# Patient Record
Sex: Male | Born: 1945
Health system: Southern US, Community
[De-identification: ages and names within clinical notes are randomized; demographics above are authoritative.]

## PROBLEM LIST (undated history)

## (undated) DIAGNOSIS — I739 Peripheral vascular disease, unspecified: Secondary | ICD-10-CM

## (undated) DIAGNOSIS — I2510 Atherosclerotic heart disease of native coronary artery without angina pectoris: Secondary | ICD-10-CM

## (undated) DIAGNOSIS — Z7739 Contact with and (suspected) exposure to other war theater: Secondary | ICD-10-CM

## (undated) DIAGNOSIS — I6529 Occlusion and stenosis of unspecified carotid artery: Secondary | ICD-10-CM

## (undated) DIAGNOSIS — N182 Chronic kidney disease, stage 2 (mild): Secondary | ICD-10-CM

## (undated) DIAGNOSIS — E7800 Pure hypercholesterolemia, unspecified: Secondary | ICD-10-CM

## (undated) DIAGNOSIS — I10 Essential (primary) hypertension: Secondary | ICD-10-CM

## (undated) DIAGNOSIS — S065X9A Traumatic subdural hemorrhage with loss of consciousness of unspecified duration, initial encounter: Secondary | ICD-10-CM

## (undated) DIAGNOSIS — Z77098 Contact with and (suspected) exposure to other hazardous, chiefly nonmedicinal, chemicals: Secondary | ICD-10-CM

## (undated) HISTORY — DX: Peripheral vascular disease, unspecified: I73.9

## (undated) HISTORY — PX: CORONARY ARTERY BYPASS GRAFT: SHX141

## (undated) HISTORY — DX: Traumatic subdural hemorrhage with loss of consciousness of unspecified duration, initial encounter: S06.5X9A

## (undated) HISTORY — PX: CORONARY ANGIOPLASTY WITH STENT PLACEMENT: SHX49

---

## 1998-03-15 ENCOUNTER — Ambulatory Visit (HOSPITAL_COMMUNITY): Admission: RE | Admit: 1998-03-15 | Discharge: 1998-03-15 | Payer: Self-pay | Admitting: *Deleted

## 1998-03-17 ENCOUNTER — Encounter: Payer: Self-pay | Admitting: *Deleted

## 1998-03-17 ENCOUNTER — Inpatient Hospital Stay (HOSPITAL_COMMUNITY): Admission: EM | Admit: 1998-03-17 | Discharge: 1998-03-21 | Payer: Self-pay | Admitting: *Deleted

## 2002-03-10 HISTORY — PX: CAROTID ENDARTERECTOMY: SUR193

## 2003-01-23 ENCOUNTER — Ambulatory Visit (HOSPITAL_COMMUNITY): Admission: RE | Admit: 2003-01-23 | Discharge: 2003-01-23 | Payer: Self-pay | Admitting: Cardiology

## 2003-01-31 ENCOUNTER — Ambulatory Visit (HOSPITAL_COMMUNITY): Admission: RE | Admit: 2003-01-31 | Discharge: 2003-01-31 | Payer: Self-pay | Admitting: Vascular Surgery

## 2003-02-03 ENCOUNTER — Ambulatory Visit (HOSPITAL_COMMUNITY): Admission: RE | Admit: 2003-02-03 | Discharge: 2003-02-03 | Payer: Self-pay | Admitting: Cardiothoracic Surgery

## 2003-02-03 ENCOUNTER — Encounter (INDEPENDENT_AMBULATORY_CARE_PROVIDER_SITE_OTHER): Payer: Self-pay | Admitting: Cardiology

## 2003-02-06 ENCOUNTER — Inpatient Hospital Stay (HOSPITAL_COMMUNITY): Admission: RE | Admit: 2003-02-06 | Discharge: 2003-02-11 | Payer: Self-pay | Admitting: Cardiothoracic Surgery

## 2003-03-10 ENCOUNTER — Encounter (INDEPENDENT_AMBULATORY_CARE_PROVIDER_SITE_OTHER): Payer: Self-pay | Admitting: Specialist

## 2003-03-10 ENCOUNTER — Inpatient Hospital Stay (HOSPITAL_COMMUNITY): Admission: RE | Admit: 2003-03-10 | Discharge: 2003-03-13 | Payer: Self-pay | Admitting: Vascular Surgery

## 2003-03-16 ENCOUNTER — Encounter: Admission: RE | Admit: 2003-03-16 | Discharge: 2003-03-16 | Payer: Self-pay | Admitting: Cardiothoracic Surgery

## 2003-04-01 ENCOUNTER — Inpatient Hospital Stay (HOSPITAL_COMMUNITY): Admission: EM | Admit: 2003-04-01 | Discharge: 2003-04-06 | Payer: Self-pay | Admitting: Vascular Surgery

## 2003-04-01 ENCOUNTER — Emergency Department (HOSPITAL_COMMUNITY): Admission: EM | Admit: 2003-04-01 | Discharge: 2003-04-01 | Payer: Self-pay | Admitting: Emergency Medicine

## 2003-05-25 ENCOUNTER — Ambulatory Visit (HOSPITAL_COMMUNITY): Admission: RE | Admit: 2003-05-25 | Discharge: 2003-05-25 | Payer: Self-pay | Admitting: Cardiology

## 2006-07-27 ENCOUNTER — Encounter: Payer: Self-pay | Admitting: Gastroenterology

## 2006-08-24 ENCOUNTER — Encounter (INDEPENDENT_AMBULATORY_CARE_PROVIDER_SITE_OTHER): Payer: Self-pay | Admitting: *Deleted

## 2006-08-24 ENCOUNTER — Inpatient Hospital Stay (HOSPITAL_COMMUNITY): Admission: EM | Admit: 2006-08-24 | Discharge: 2006-08-26 | Payer: Self-pay | Admitting: Emergency Medicine

## 2006-08-25 ENCOUNTER — Ambulatory Visit: Payer: Self-pay | Admitting: Vascular Surgery

## 2006-12-02 ENCOUNTER — Ambulatory Visit: Payer: Self-pay | Admitting: Vascular Surgery

## 2007-07-14 ENCOUNTER — Ambulatory Visit: Payer: Self-pay | Admitting: Vascular Surgery

## 2007-08-31 ENCOUNTER — Ambulatory Visit: Payer: Self-pay | Admitting: *Deleted

## 2007-09-22 ENCOUNTER — Encounter: Payer: Self-pay | Admitting: Gastroenterology

## 2007-10-15 ENCOUNTER — Ambulatory Visit: Payer: Self-pay | Admitting: Gastroenterology

## 2007-10-26 ENCOUNTER — Encounter: Payer: Self-pay | Admitting: Gastroenterology

## 2007-10-26 ENCOUNTER — Ambulatory Visit: Payer: Self-pay | Admitting: Gastroenterology

## 2007-10-28 ENCOUNTER — Encounter: Payer: Self-pay | Admitting: Gastroenterology

## 2007-11-28 ENCOUNTER — Emergency Department (HOSPITAL_COMMUNITY): Admission: EM | Admit: 2007-11-28 | Discharge: 2007-11-28 | Payer: Self-pay | Admitting: Emergency Medicine

## 2010-03-30 ENCOUNTER — Encounter: Payer: Self-pay | Admitting: Cardiothoracic Surgery

## 2010-07-23 NOTE — Procedures (Signed)
DUPLEX DEEP VENOUS EXAM - LOWER EXTREMITY   INDICATION:  Right lower extremity pain.   HISTORY:  Edema:  No.  Trauma/Surgery:  No.  Pain:  Yes.  PE:  No.  Previous DVT:  None.  Anticoagulants:  None.  Other:   DUPLEX EXAM:                CFV   SFV   PopV  PTV    GSV                R  L  R  L  R  L  R   L  R  L  Thrombosis    o  o  o     o     o      o  Spontaneous   +  +  +     +     +      +  Phasic        +  +  +     +     +      +  Augmentation  +  +  +     +     +      +  Compressible  +  +  +     +     +      +  Competent     +  +  +     +     +      +   Legend:  + - yes  o - no  p - partial  D - decreased   IMPRESSION:  No evidence of deep venous thrombosis noted in the right  leg.   Notified Melissa with the results.    _____________________________  P. Liliane Bade, M.D.   MG/MEDQ  D:  08/31/2007  T:  08/31/2007  Job:  [id number]

## 2010-07-23 NOTE — Consult Note (Signed)
Andre Warren, Andre Warren               ACCOUNT NO.:  [id number]   MEDICAL RECORD NO.:  [id number]          PATIENT TYPE:  INP   LOCATION:  3735                         FACILITY:  [facility]   PHYSICIAN:  Wilmon Arms. Corliss Skains, M.D. DATE OF BIRTH:  [birth year]   DATE OF CONSULTATION:  08/24/2006  DATE OF DISCHARGE:                                 CONSULTATION   REASON FOR CONSULTATION:  Partial small bowel obstruction.   HISTORY OF PRESENT ILLNESS:  The patient is a 65 year old male with a  past medical history significant for coronary artery disease and carotid  stenosis who presents with a half a day or so of severe midline  abdominal pain.  The patient states that over the weekend he had some  peanuts, but otherwise no change in his diet or habits.  The pain began  in the lower midline.  He did experience some bloating, but denied any  nausea or vomiting.  He did try to self induce some vomiting, but this  failed to improve the symptoms.  The patient reports several normal  bowel movements yesterday.  The patient also reports some left facial  numbness.  Due to these symptoms, he presented to the emergency  department for evaluation.  He was admitted by the [facility] service and  is undergoing a TIA workup.   PAST MEDICAL HISTORY:  1. Coronary artery disease status post MI 2000.  2. Carotid stenosis.  3. Hypertension.  4. Hyperlipidemia.  5. Hypothyroidism.   PAST SURGICAL HISTORY:  1. CABG x3 2004.  2. Right carotid endarterectomy by Dr. Darrick Penna in 2004.   ALLERGIES:  None.   MEDICATIONS:  1. Toprol XL 50 mg daily.  2. Cozaar 50 mg daily.  3. Lipitor 40 mg daily.  4. Zetia 10 mg daily.  5. Levothyroxine 25 mg daily.  6. Aspirin 325 daily.   SOCIAL HISTORY:  Nonsmoker, nondrinker.   FAMILY HISTORY:  Noncontributory.   PHYSICAL EXAMINATION:  VITAL SIGNS:  Temperature 97.9, pulse 70, blood  pressure 159/96, respirations 20, 98%.  GENERAL APPEARANCE:  This is a well-developed,  well-nourished male in no  apparent distress.  HEENT:  EOMI.  Sclerae anicteric.  NECK:  No masses.  No thyromegaly.  LUNGS:  Clear.  Normal respiratory effort.  HEART:  Regular rate and rhythm.  No murmur.  ABDOMEN:  Nondistended.  He has some bowel sounds.  Mild tenderness in  the lower midline.  Soft with no palpable masses.  No rebound or  guarding.  No sign of umbilical hernia.   X-RAYS:  Chest x-ray is negative.  Acute abdominal series shows a  dilated loop of small bowel in the upper abdomen.  CT scan shows partial  small bowel obstruction with apparent transition in the caliber of the  small bowel in the lower midline abdomen.  There is some mild  inflammation in the surrounding fat.  There is no sign of bowel wall  thickening.  There is some minimal free fluid in the pelvis.   LABORATORY DATA:  White count 13.5, hemoglobin 16.5, platelet count 267.  Lipase normal.  Amylase  is normal.  Liver function tests:  Bilirubin is  1.5, indirect is 1.2.  INR 1.1.  Troponin is less than 0.05.  Creatinine  is 1.2.   IMPRESSION:  1. Partial small bowel obstruction from unclear etiology.  Currently      the patient is minimally symptomatic.  2. Possible transient ischemic attacks with left-sided facial      numbness.   RECOMMENDATIONS:  Continued TIA workup as you are doing with carotid  Doppler and 2-D echo.  Continue bowel rest with n.p.o. and IV hydration.  No acute surgical indication at this time.  Repeat films in the morning.  If the patient becomes distended and begins having nausea and vomiting,  then he should have a nasogastric tube placed.      Wilmon Arms. Tsuei, M.D.  Electronically Signed     MKT/MEDQ  D:  08/24/2006  T:  08/24/2006  Job:  [id number]

## 2010-07-23 NOTE — Procedures (Signed)
CAROTID DUPLEX EXAM   INDICATION:  Follow-up of known carotid artery disease.   HISTORY:  Diabetes:  No  Cardiac:  MI in 2000 (CABG)  Hypertension:  Yes  Smoking:  No  Previous Surgery:  Right CEA in 2004  CV History:  Amaurosis Fugax No, Paresthesias No, Hemiparesis No   TECHNOLOGIST:  Stann Ore, BS.  Leta Jungling, BS, RVT.   REFERRING:   INTERPRETER:  Janetta Hora. Fields, MD.   PROCEDURE:  Carotid duplex exam.                                       RIGHT             LEFT  Brachial systolic pressure:         170               180  Brachial Doppler waveforms:         Within normal limits                Within normal limits  Vertebral direction of flow:        Antegrade         Antegrade  DUPLEX VELOCITIES (cm/sec)  CCA peak systolic                   82                92  ECA peak systolic                   58                71  ICA peak systolic                   57                188  ICA end diastolic                   25                68  PLAQUE MORPHOLOGY:                  None              Mixed  PLAQUE AMOUNT:                      None              Moderate  PLAQUE LOCATION:                    N/A               ICA   IMPRESSION:  1. No evidence of right ICA stenosis status post endarterectomy.  2. Left ICA with a 60-79% stenosis with elevated velocities noted this      visit however, the category remains unchanged.  3. Bilateral external carotid and vertebral arteries are within normal      limits.   ___________________________________________  Janetta Hora Darrick Penna, M.D.   PB/MEDQ  D:  12/02/2006  T:  12/03/2006  Job:  [id number]

## 2010-07-23 NOTE — H&P (Signed)
NAMECHARLETON, DEYOUNG NO.:  [id number]   MEDICAL RECORD NO.:  [id number]          PATIENT TYPE:  EMS   LOCATION:  MAJO                         FACILITY:  [facility]   PHYSICIAN:  Madaline Savage, MD        DATE OF BIRTH:  05/31/1945   DATE OF ADMISSION:  08/24/2006  DATE OF DISCHARGE:                              HISTORY & PHYSICAL   PRIMARY CARE PHYSICIAN:  [name], M.D.   CHIEF COMPLAINT:  Abdominal pain and numbness of face.   HISTORY OF PRESENT ILLNESS:  Mr. Biller is a 65 year old Caucasian male  with a history of coronary artery disease status post CABG, right  carotid artery stenosis, status post endarterectomy, hypertension,  hyperlipidemia, who comes in with severe abdominal pain which started  today.  He tells me that he apparently took some peanuts yesterday and  he started having abdominal pain today.  The pain was central.  He rates  it a 10/10, and it was colicky in nature.  The pain had waxing and  waning nature.  There was no radiation of the pain.  He had severe  nausea.  He was belching, but did not vomit anything.  The belching  relieved his pain somewhat, and he also noted that he had left sided  facial numbness which lasted approximately one hour.  He states that he  had similar complaints when he had his right sided carotid obstruction.  He also tells me that he had double vision one week ago which lasted  approximately 10 minutes.  At this point in time, his pain is better  controlled.   PAST MEDICAL HISTORY:  1. Coronary artery disease status post MI in 2000.  He also had a      coronary artery bypass graft x3 in 2004.  2. Right sided carotid artery stenosis.  He had right sided carotid      endarterectomy in 2004.  He follows with Dr. Darrick Penna.  He says he      also has left sided carotid artery stenosis.  3. Hypertension.  4. Hyperlipidemia.  5. Recently diagnosed hypothyroidism.   PAST SURGICAL HISTORY:  1. Coronary artery bypass  graft in 2004.  2. Right carotid endarterectomy 2004.   ALLERGIES:  No known drug allergies.   CURRENT MEDICATIONS:  1. Toprol XL 50 mg daily.  2. Cozaar 50 mg daily.  3. Lipitor 40 mg daily.  4. Zetia 10 mg daily.  5. Levothyroxine 25 mg daily.  6. Aspirin 325 mg daily.   SOCIAL HISTORY:  Denies any history of smoking, alcohol or drug abuse.   FAMILY HISTORY:  Noncontributory.   REVIEW OF SYSTEMS:  GENERAL:  She denies any recent weight loss, weight  gain, no fever or chills.  HEENT:  No headaches.  He did have double  vision a week ago.  He does complain of numbness in the right side of  his face.  CARDIOVASCULAR:  Denies chest pain or palpitations.  RESPIRATORY:  No shortness of breath or cough.  GI:  Does have abdominal  pain which has improved.  He also complains of nausea.  No diarrhea or  constipation.  EXTREMITIES:  No tingling or numbness of toes or fingers.   PHYSICAL EXAMINATION:  GENERAL:  He is alert and oriented x3.  VITAL SIGNS:  Pulse 90, blood pressure 169/96, oxygen saturation 99% on  room air.  HEENT:  Normocephalic, atraumatic.  Pupils bilaterally reactive to  light.  Mucous membranes moist.  NECK:  Supple.  No JVD.  No carotid bruits.  CARDIAC:  S1, S2 heard.  Regular rate and rhythm.  CHEST:  Clear to auscultation.  ABDOMEN:  Mild tenderness in the central abdomen due to decreased bowel  sounds.  EXTREMITIES:  No edema, cyanosis or clubbing.   LABORATORY DATA:  Lipase is 70, amylase 43, hemoglobin 18, hematocrit  53, sodium 133, potassium 4.1, chloride 104, bicarbonate 20, BUN 18,  creatinine 1.2.  Liver enzymes were within normal limits.  Troponin less  than 0.05.  X-ray of the chest shows no active disease.  Abdominal CT  shows early or partial small bowel obstruction.  CT of the abdomen and  pelvis shows partial small bowel obstruction.  No sign of bowel wall  thickening or cause of obstruction.   IMPRESSION:  1. Partial small bowel  obstruction.  2. Questionable transient ischemic attack.  3. History of carotid artery stenosis.  4. Pulmonary artery disease.  5. Hypertension.  6. Hyperlipidemia.  7. Hypothyroid.   PLAN:  This is a 65 year old gentleman who comes in with a partial small  bowel obstruction.  I will admit him to a monitored floor, keep him  n.p.o. at this time.  He is not nauseated at this time, and he is not  throwing up.  I will hold his p.o. medications this time as I will keep  him n.p.o., and I will consult surgery to see him in the morning.  I  will continue to hydrate him with IV fluids. He gives a history of a  transient ischemic attack, and he does have a significant history of  carotid artery stenosis on his left side.  I will get carotid Dopplers,  and I will also initiate workup for a TIA including a 2D echocardiogram.  I will also check a TSH on him.  I will put him on p.r.n. Lopressor for  his blood pressure.  Once he is able to take p.o., we will restart his  p.o. medications.  I will put him on DVT and GI prophylaxis.      Madaline Savage, MD  Electronically Signed     PKN/MEDQ  D:  08/24/2006  T:  08/24/2006  Job:  [id number]

## 2010-07-23 NOTE — Procedures (Signed)
CAROTID DUPLEX EXAM   INDICATION:  Followup, known carotid artery disease.   HISTORY:  Diabetes:  No.  Cardiac:  MI in 2000.  Hypertension:  Yes.  Smoking:  No.  Previous Surgery:  Right CEA.  CV History:  Amaurosis Fugax No, Paresthesias No, Hemiparesis No                                       RIGHT             LEFT  Brachial systolic pressure:         140               130  Brachial Doppler waveforms:         Biphasic          Biphasic  Vertebral direction of flow:        Antegrade         Antegrade  DUPLEX VELOCITIES (cm/sec)  CCA peak systolic                   73                83  ECA peak systolic                   50                50  ICA peak systolic                   51                168  ICA end diastolic                   20                75  PLAQUE MORPHOLOGY:                  None              Heterogenous  PLAQUE AMOUNT:                      None              Moderate  PLAQUE LOCATION:                    None              ICA, ECA   IMPRESSION:  1. 40-59% stenosis noted in the left internal carotid artery.  2. Normal carotid duplex noted in the right internal carotid artery,      status post right carotid endarterectomy.  3. Antegrade bilateral vertebral arteries.   ___________________________________________  Janetta Hora Fields, MD   MG/MEDQ  D:  07/14/2007  T:  07/14/2007  Job:  [id number]

## 2010-07-26 NOTE — Op Note (Signed)
NAME:  LIEM, COPENHAVER                         ACCOUNT NO.:  [id number]   MEDICAL RECORD NO.:  [id number]                   PATIENT TYPE:  INP   LOCATION:  2302                                 FACILITY:  [facility]   PHYSICIAN:  Di Kindle. Edilia Bo, M.D.        DATE OF BIRTH:  1945-08-19   DATE OF PROCEDURE:  04/01/2003  DATE OF DISCHARGE:                                 OPERATIVE REPORT   PREOPERATIVE DIAGNOSIS:  Right neck abscess.   POSTOPERATIVE DIAGNOSIS:  Right neck abscess.   PROCEDURE:  Incision and drainage of right neck abscess.   SURGEON:  Di Kindle. Edilia Bo, M.D.   ASSISTANT:  Toribio Harbour, N.P.   ANESTHESIA:  General.   INDICATIONS:  This is a 65 year old gentleman who had undergone a right  carotid endarterectomy with Dacron patch angioplasty on 03/09/04 for a  symptomatic right carotid stenosis.  In reviewing the operative report, it  was noted that the plaque extended very high, well up above the hypoglossal  nerve.  He developed the sudden onset of swelling in his neck last night and  came to the [facility] emergency room where I saw him, and then he was  transferred here after his CAT scan showed an abscess in the right neck.  The plan was to drain the abscess and then potentially have to remove the  Dacron patch and replace it with vein if the patch was involved.  The  procedure and the potential complications were discussed with both the  patient and his wife.  All of their questions were answered, and they were  agreeable to proceed.   TECHNIQUE:  The patient was taken to the operating room and received a  general anesthetic.  The right neck was prepped and draped in the usual  sterile fashion.  I also prepped the chest into the field, and we also  prepped the left thigh into the field in case we needed a vein.  The  previous incision was opened, and I did extend the incision further  proximally and distally.  As the neck was somewhat pulsatile,  I wanted to be  sure we could get adequate control if necessary.  There was dense scar  tissue beneath the sternocleidomastoid.  I did enter the abscess pocket,  which was approximately 3 cm in diameter.  A stat culture was obtained, and  these results were still pending.  Both aerobic and anaerobic cultures were  obtained.  This pocket was thoroughly irrigated with antibiotic solution,  and there was no exposed Dacron graft.  I did further dissect down through  the scar tissue to be sure there was no other undrained areas, and I could  not find any other undrained areas.  There was significant scar tissue  present.  Given the very difficult dissection, it was noted at the time of  the original procedure, and given that at this point that there was exposed  patch,  I did not think it would be wise to more aggressively try to expose  the carotid artery and patch as I think this has a good chance of healing as  this appears to have simply been localized abscess.  In addition because of  the very high nature of the previous plaque and the high nature of the  repair with significant scar tissue, I think this would be associated with  significant risk to replace it, especially if it is not necessary.  Therefore, at this point, we simply packed the wound after hemostasis was  obtained.  I closed the wound from both ends with a deep layer of 3-0 Vicryl  and then interrupted 3-0 nylons and left the area of the abscess open so  that it could be packed.  The patient  was on Rocephin, and we will tailor his antibiotics pending the results of  this and a gram stain and culture results.  A sterile dressing was applied.  The patient tolerated the procedure well and was transferred to the recovery  room in satisfactory condition.  All needle and sponge counts were correct.                                               Di Kindle. Edilia Bo, M.D.    CSD/MEDQ  D:  04/01/2003  T:  04/01/2003  Job:   [id number]

## 2010-07-26 NOTE — Op Note (Signed)
NAME:  Andre Warren, Andre Warren                         ACCOUNT NO.:  [id number]   MEDICAL RECORD NO.:  [id number]                   PATIENT TYPE:  INP   LOCATION:  2855                                 FACILITY:  [facility]   PHYSICIAN:  Janetta Hora. Fields, MD               DATE OF BIRTH:  07-02-1945   DATE OF PROCEDURE:  03/10/2003  DATE OF DISCHARGE:                                 OPERATIVE REPORT   PREOPERATIVE DIAGNOSIS:  Symptomatic right internal carotid artery stenosis.   POSTOPERATIVE DIAGNOSIS:  Symptomatic right internal carotid artery  stenosis.   PROCEDURE:  Right carotid endarterectomy.   ANESTHESIA:  General.   ASSISTANT:  Balinda Quails, M.D.   INDICATIONS:  The patient is a 65 year old male with a known 75% right  internal carotid artery stenosis by arteriography.  He has recently begun to  have TIA symptoms with numbness and tingling in his left arm and left leg,  which have lasted less than 30 minutes.  He has also had some right eye  blurriness.   FINDINGS:  1. High internal carotid artery stenosis requiring extensive mobilization of     cranial nerve XII.  2. Dacron patch angioplasty.  3. Pruitt hunt.   OPERATIVE DETAILS:  After obtaining informed consent, the patient was taken  to the operating room.  Informed consent included risks of stroke,  myocardial infarction, cranial nerve injury, and infection, among others.  Next the patient was taken to the operating room and placed in the supine  position on the operating room table.  After induction of general anesthesia  and endotracheal intubation, a Foley catheter was placed. Next the patient's  entire right neck and chest were prepped and draped in the usual sterile  fashion.  The skin incision was made just anterior to the border of the  sternocleidomastoid muscle.  The platysma was divided.  The internal jugular  vein was retracted laterally.  The common carotid artery was dissected free  circumferentially and  controlled with an umbilical tape.  The vagus nerve  was identified and protected from harm's way.  Next the dissection proceeded  up the carotid artery.  The external carotid artery was dissected free  circumferentially and controlled with a vessel loop.  The superior thyroid  artery was dissected free circumferentially and controlled with a vessel  loop.  It was noted at this point that the lesion in the internal carotid  artery was quite high.  This required extensive mobilization of the  hypoglossal nerve and division of the occipital branch of the external  carotid artery, and there was also division of the posterior belly of the  digastric muscle.  At this point there was enough mobilization to reach the  upper extent of the plaque.  The internal carotid artery was dissected free  circumferentially at this level and controlled with a vessel loop.  The  patient was  then given  5000 units of intravenous heparin.  He was also  given one extra bolus of 2000 units of heparin later on in the case.  The  internal carotid artery was clamped with a serrefine clamp.  The external  carotid artery and superior thyroid artery were controlled with vessel  loops, and the common carotid artery was clamped wit a DeBakey clamp.  Next  an 11 blade was used to open the common carotid artery and Potts scissors  were used to open the artery up into the internal carotid artery past the  level of disease.  There was some thickening of the distal internal carotid  artery, but there was a disease-free segment at the level of the  arteriotomy.  Next an endarterectomy was begun in an appropriate plane and  the endarterectomy proceeded up into the external carotid artery and an  eversion endarterectomy was done in this location and then up into the  internal carotid artery to a suitable end point.  Plaque was then removed  and sent to pathology as a specimen.  Next all loose debris was removed from  the carotid  artery and a patch angioplasty was performed using a Dacron  patch and a running 6-0 Prolene suture.  Prior to performing the  endarterectomy, a Pruitt shunt was placed with approximately five minutes of  ischemic time.  After completion of the endarterectomy, the shunt was  clamped and removed and all arteries thoroughly backbled, forebled, and  thoroughly flushed.  After the patch angioplasty was completed, hemostasis  was obtained.  Doppler examination of the internal carotid artery, external  carotid artery, and common carotid artery showed good Doppler flow.  The  patient was then given 50 mg of protamine.  The platysma muscle was closed  with a running Vicryl suture.  The skin was closed with a 4-0 Vicryl  subcuticular stitch.  The patient tolerated the procedure well, and there  were no complications.  The patient was extubated in the operating room and  taken to the recovery room in stable condition.  He was neurologically  intact with symmetric upper and lower extremity strength at the end of the  case.                                               Janetta Hora. Darrick Penna, MD    CEF/MEDQ  D:  03/10/2003  T:  03/10/2003  Job:  [id number]

## 2010-07-26 NOTE — Discharge Summary (Signed)
NAME:  Andre Warren, Andre Warren                         ACCOUNT NO.:  [id number]   MEDICAL RECORD NO.:  [id number]                   PATIENT TYPE:  INP   LOCATION:  2035                                 FACILITY:  [facility]   PHYSICIAN:  Gwenith Daily. Tyrone Sage, M.D.            DATE OF BIRTH:  1946-01-06   DATE OF ADMISSION:  02/06/2003  DATE OF DISCHARGE:  02/11/2003                                 DISCHARGE SUMMARY   HISTORY OF PRESENT ILLNESS:  The patient is a 65 year old male who notes  several months of exertional chest discomfort radiating to his right arm,  and at times into his left arm, relieved with rest.  He had a previous  history of cardiac disease including a myocardial infarction in 2000, at  which time he underwent angioplasty of the right coronary artery.  He has  done well until recently, and sought medical attention because of the change  in symptoms, and was re-cathed by Dr. Aleen Campi.  The patient was  subsequently referred to Sheliah Plane, M.D. to evaluate the patient and  his studies, including the cardiac catheterization which showed a 95%  stenosis in the mid-LAD, and 80% stenosis in the proximal circumflex.  The  right coronary artery is totally occluded.  The overall left ventricular  function was preserved.  The patient did have some degree of mitral  regurgitation that was felt to be hard to judge on the film.  Due to these  findings, the patient was recommended surgical revascularization, and he was  admitted this hospitalization for the procedure.   PAST MEDICAL HISTORY:  1. Coronary artery disease as described above.  2. Hypertension controlled on medications.  3. Hyperlipidemia.   FAMILY HISTORY:  Positive family history for myocardial infarction in his  father at age 72.  Both mother and father died of cerebrovascular accident.   MEDICATIONS ON ADMISSION:  1. Cozaar 50 mg daily.  2. Toprol 50 mg daily.  3. Lipitor 40 mg daily.  4. Recently in the past he was  started on Plavix.   ALLERGIES:  No known drug allergies.   SOCIAL HISTORY/REVIEW OF SYMPTOMS/PHYSICAL EXAMINATION:  Please see the  history and physical done at the time of admission.   HOSPITAL COURSE:  The patient was admitted electively, and on February 06, 2003, he was taken to the cardiac operating room where he underwent the  following procedure - coronary artery bypass grafting x3.  The following  grafts were placed - (1) left internal mammary artery to the first obtuse  marginal coronary artery, (2) right internal mammary artery as a pedicle  graft to the left anterior descending coronary artery, and finally (3) a  reverse saphenous vein graft to the posterior descending coronary artery.  The patient tolerated the procedure well, and was taken to the post-  anesthesia care unit in stable condition.   NOTE:  On preoperative carotid duplex examination, it was found  that the  patient had a 50-60% lesion in his left internal carotid artery, and an 80%  stenosis in the right internal coronary artery.  It was not felt by Dr.  Tyrone Sage in consultation with Dr. Darrick Penna that the patient would require  procedure concurrent with his cardiac revascularization; however, he will  follow up as an outpatient.   POSTOPERATIVE HOSPITAL COURSE:  The patient has done quite well.  He had no  difficulty with postoperative bleeding.  Hemodynamically, he has remained  stable.  He did have an episode of rapid atrial fibrillation; however, this  converted with the institution of intravenous amiodarone back to a normal  sinus rhythm.  He has subsequently been started on oral dosing, and he is  maintaining normal sinus rhythm.  The patient has had discontinuation of all  routine lines, monitors, and drainage devices in a standard fashion.  His  oxygen has been weaned, and he maintains good saturations on room air.  He  has tolerated a rapid advancement in activities using cardiac rehabilitation  phase  I modalities without difficulty.  His incisions are healing quite well  without signs of infection.   LABORATORY DATA:  The most recent laboratories are stable.  He does have a  mild anemia.  The most recent hemoglobin and hematocrit are 10 and 31  respectively.  Electrolytes, BUN, and creatinine was all within normal  limits.   Currently, the patient is felt to be quite stable.  Tentatively, he is  scheduled for discharge on the morning of February 11, 2003, pending morning  round reevaluation.   MEDICATIONS ON DISCHARGE:  1. Amiodarone 200 mg twice daily for three weeks.  2. Lipitor 40 mg daily.  3. Aspirin 325 mg daily.  4. Toprol-XL 50 mg daily.  5. He will possibly resume his ACE inhibitor further down during his     recovery; however, blood pressure is not felt to warrant restarting at     this time.  6. For pain, Tylox 1-2 q.4-6h. as needed.   INSTRUCTIONS:  The patient received written instructions in regard to  medications, activity, diet, wound care, and follow up.   FOLLOW UP:  1. Dr. Tyrone Sage on March 02, 2003 at 11:30 with a chest x-ray from the     [facility].  2. Additionally, the patient is instructed to contact Dr. Aleen Campi for a two-     week follow up appointment.   FINAL DIAGNOSES:  1. Severe coronary artery disease, now status post surgical     revascularization, as described.  2. Previous myocardial infarction in 2000.  3. Previous angioplasty of the right coronary artery in 2000.  4. Postoperative atrial fibrillation.  5. Mild postoperative anemia.  6. Hyperlipidemia.  7. Hypertension.  8. Bilateral right greater than left carotid artery disease.      Rowe Clack, P.A.-C.                    Gwenith Daily Tyrone Sage, M.D.    Sherryll Burger  D:  02/10/2003  T:  02/11/2003  Job:  [id number]   cc:   Aram Candela. Aleen Campi, M.D.  [address]  [place]  [place] [address]  Fax: [phone]   [name], M.D. [address]  [address]  [place], [place] [address]  Fax: [phone]

## 2010-07-26 NOTE — Op Note (Signed)
NAME:  Andre Warren, Andre Warren                         ACCOUNT NO.:  [id number]   MEDICAL RECORD NO.:  [id number]                   PATIENT TYPE:  INP   LOCATION:  2009                                 FACILITY:  [facility]   PHYSICIAN:  Janetta Hora. Fields, MD               DATE OF BIRTH:  January 25, 1946   DATE OF PROCEDURE:  04/05/2003  DATE OF DISCHARGE:                                 OPERATIVE REPORT   PROCEDURE:  Irrigation of right neck wound with delayed primary closure.   PREOPERATIVE DIAGNOSIS:  Status post drainage of right neck wound infection.   POSTOPERATIVE DIAGNOSIS:  Status post drainage of right neck wound  infection.   ANESTHESIA:  Local with IV sedation.   INDICATIONS FOR PROCEDURE:  The patient is a 65 year old male who is status  post right carotid endarterectomy who developed a superficial wound  infection postoperatively.  He now presents for delayed primary closure of  his neck wound.   FINDINGS:  1. No evidence of exposed Dacron prosthetic.  2. Clean wound with no obvious evidence of active infection.   PROCEDURE IN DETAIL:  After obtaining informed consent, the patient was  taken to the operating room.  The patient was placed in the supine position  on the operating table.  The patient's entire right neck and chest were  prepped and draped in the usual sterile fashion.  Local anesthesia was  infiltrated along the neck wound and then the neck wound was thoroughly  irrigated with 2 liters of normal saline solution and one liter of  antibiotic irrigation solution.  There was no obvious evidence of gross  purulence throughout the neck wound.  The tissues were cleaned with  granulation tissue.  There was no exposed Dacron.  Next, the wound was  closed approximately 75% of its length with interrupted 3-0 nylon vertical  mattress sutures.  The remainder of the wound was then packed with saline  moistened Kerlix.  The patient tolerated the procedure well and there were  no  complications.  The instrument, sponge, and needle counts were correct at  the end of the case.  The patient was taken to the recovery room in stable  condition.  The assistant for the case was the nurse.                                               Janetta Hora. Fields, MD    CEF/MEDQ  D:  04/05/2003  T:  04/05/2003  Job:  [id number]

## 2010-07-26 NOTE — H&P (Signed)
NAME:  Andre Warren, Andre Warren                         ACCOUNT NO.:  [id number]   MEDICAL RECORD NO.:  [id number]                   PATIENT TYPE:  INP   LOCATION:  NA                                   FACILITY:  [facility]   PHYSICIAN:  Janetta Hora. Fields, MD               DATE OF BIRTH:  Dec 12, 1945   DATE OF ADMISSION:  DATE OF DISCHARGE:                                HISTORY & PHYSICAL   DATE OF ADMISSION:  March 10, 2003   CHIEF COMPLAINT:  Carotid artery disease.   HISTORY OF PRESENT ILLNESS:  This is a 65 year old male referred to Dr.  Darrick Penna by Dr. Tyrone Sage for evaluation of carotid artery disease.  The  patient was scheduled to have a coronary artery bypass grafting surgery by  Dr. Tyrone Sage and at his preoperative screening the patient was noted to have  a greater than 80%  right internal carotid artery stenosis.  The patient was  therefore referred to Dr. Darrick Penna for further evaluation prior to proceeding  with the coronary artery bypass grafting.  The patient was completely  asymptomatic and denied history of TIA, CVA, and amaurosis fugax.  The  patient also denies any history of smoking or diabetes mellitus.  The  patient underwent a carotid ultrasound and carotid arteriogram prior to  coronary artery bypass grafting surgery and it was felt by Dr. Darrick Penna the  patient did in fact need a carotid endarterectomy but it did not need to  coincide with the CABG.  The patient underwent coronary artery bypass  grafting surgery on February 06, 2003 by Dr. Tyrone Sage without complication.  The patient is now returned for history and physical exam prior to right  carotid endarterectomy which will be performed by Dr. Darrick Penna.  The carotid  ultrasound on November 15 revealed a peak systolic velocity of 400 cm per  second and end-diastolic velocity of 180 cm per second on the right.  On the  left there was peak systolic velocity of 109 cm per second and a end-  diastolic velocity of 58 cm per second.   The results of the carotid  angiogram performed on January 31, 2003 were not available at the time of  this dictation.  The patient does experience occasional dyspnea on exertion;  however, he denies headaches, nausea, vomiting, dizziness, seizures, muscle  weakness, speech impairment, and dysphagia.  The patient also denies syncope  and memory loss.  The patient has been experiencing some numbness and  tingling on several places on his face although there is nothing consistent  and the area is not consistent.  The patient has also experienced three  episodes of seeing white spots in the right eye.  It is Dr. Evelina Dun  opinion that the patient should undergo right carotid endarterectomy on  March 10, 2003.   PAST MEDICAL HISTORY:  1. Carotid artery disease.  2. Coronary artery disease status post CABG February 06, 2003.  3. Myocardial infarction in 2000.  4. Hypertension.  5. Hypercholesterolemia.   PAST SURGICAL HISTORY:  1. Angioplasty and stenting.  2. Coronary artery bypass grafting in 2004.   MEDICATIONS:  1. Cozaar 50 mg p.o. daily.  2. Lipitor 40 mg p.o. daily.  3. Toprol-XL 50 mg p.o. daily.  4. Aspirin 325 mg p.o. daily.  5. Amiodarone 200 mg p.o. daily.   ALLERGIES:  No known drug allergies.   REVIEW OF SYSTEMS:  See HPI for significant positives and negatives.   FAMILY HISTORY:  Mother is significant for CVA.  Father is significant for  CVA and myocardial infarction.   SOCIAL HISTORY:  This is a married white male with two children.  He is an  Airline pilot.  He denies any alcohol use and denies tobacco use.   PHYSICAL EXAMINATION:  VITAL SIGNS:  Blood pressure 118/80, pulse 66,  respirations 14.  GENERAL:  This is a 65 year old white male in no acute distress.  He is  alert and oriented x3.  HEENT:  Normocephalic, atraumatic.  Pupils equal, round, and reactive to  light and accommodation.  Extraocular movements are intact.  There are no  cataracts, glaucoma,  or macular degeneration.  NECK:  Supple.  There is no JVD, bruits, or lymphadenopathy.  CHEST:  Symmetrical on inspiration.  LUNGS:  There are no wheezes, rhonchi, or rales.  CARDIAC:  Regular rate and rhythm.  No murmurs, no rubs, no gallops.  ABDOMEN:  Soft, nontender, nondistended.  There are positive bowel sounds  present x4.  There are no masses.  GENITOURINARY AND RECTAL:  Deferred.  EXTREMITIES:  No clubbing, no cyanosis, no edema, no ulcerations.  Temperature is warm.  PULSES:  Carotid 2+ bilaterally, popliteal 2+ bilaterally,  posterior tibial  2+ bilaterally.  NEUROLOGIC:  Nonfocal.  Gait is steady.  Deep tendon reflexes are 2+.  Muscle strength is 5/5.   ASSESSMENT:  Right carotid stenosis.   PLAN:  The patient will undergo a right carotid endarterectomy  by Dr.  Darrick Penna at [facility] on March 10, 2003.  Dr. Darrick Penna has seen and  evaluated this patient prior to this admission and has explained the risks  and benefits of the procedure and the patient has agreed to continue.      Pecola Leisure, PA                      Charles E. Fields, MD    AY/MEDQ  D:  03/07/2003  T:  03/07/2003  Job:  [id number]

## 2010-07-26 NOTE — Discharge Summary (Signed)
NAME:  Andre Warren, MONACO                         ACCOUNT NO.:  [id number]   MEDICAL RECORD NO.:  [id number]                   PATIENT TYPE:  INP   LOCATION:  3314                                 FACILITY:  [facility]   PHYSICIAN:  Janetta Hora. Fields, MD               DATE OF BIRTH:  1945/08/14   DATE OF ADMISSION:  03/10/2003  DATE OF DISCHARGE:  03/13/2003                                 DISCHARGE SUMMARY   ADMISSION DIAGNOSIS:  Severe right internal carotid artery stenosis.   DISCHARGE DIAGNOSES:  1. Severe right internal carotid artery stenosis, status post right carotid     endarterectomy.  2. Coronary artery disease, status post coronary artery bypass grafting,     February 06, 2003.  3. Myocardial infarction in 2000.  4. Hypertension.  5. Hypercholesterolemia.  6. History of angioplasty and stenting.   HOSPITAL PROCEDURES/MANAGEMENT:  Right carotid endarterectomy completed on  March 10, 2003 by Dr. Janetta Hora. Fields.   CONSULTATIONS:  None.   HISTORY OF PRESENT ILLNESS:  Andre Warren is a 65 year old male who was  referred to Dr. Darrick Penna for evaluation of carotid artery disease.  The  patient was scheduled to have coronary artery bypass grafting surgery by Dr.  Ramon Dredge B. Gerhardt and with his preoperative screening, he was found to have  a greater than 80% right internal carotid artery stenosis.  The patient was  seen by Dr. Darrick Penna prior to proceeding with coronary artery bypass grafting  and he felt that the patient did in fact need a carotid endarterectomy but  it did not need to coincide with the CABG.  The patient then underwent CABG  surgery on February 06, 2003 by Dr. Tyrone Sage without complication.  The  patient then returned after fully recovering from cardiac surgery for  planned carotid endarterectomy.  The plan was for the patient to proceed  with surgery on March 10, 2003.  The patient was seen by Dr. Darrick Penna in  clinic and the risks, benefits and alternatives were  discussed with him at  that time.  The patient understood and agreed to proceed with surgery.   HOSPITAL COURSE:  Mr. Stief was then admitted to [facility] on  March 10, 2003 and underwent right carotid endarterectomy by Dr. Darrick Penna  of [facility].  The patient tolerated this procedure well without any  complications and was transferred to the postanesthesia care unit in stable  condition.  He was neurologically intact after extubation.  Once awake,  alert and appropriate, the patient was then transferred to 3300 for further  care.  The patient continued to do well postoperatively and progressed in a  routine fashion.   On postoperative day #1, the patient was feeling fairly well.  He did  complain of a headache and somewhat poor appetite, otherwise, he had no  chest pain, lightheadedness or nausea and vomiting.  He was neurologically  intact.  He remained  on a dopamine drip for low blood pressure.  He had good  urine output and was saturating well at 94% to 98% on room air.  He remained  afebrile.  His heart was in a regular rate and rhythm and in normal sinus  rhythm on telemetry.  His lab values were all within normal limits.  The  patient continued to be quite hypotensive, once the dopamine was weaned off,  with systolic blood pressures in the 90s-100s and diastolics in the 40s-50s.  The patient was relatively asymptomatic with this, although we felt that he  should be monitored more closely and remain hospitalized overnight.  The  patient was given IV fluids and more volume to help sustain his blood  pressure.  His diet was advanced as tolerated and he ambulated in the halls  without difficulty.   On postop day #2, the patient was feeling quite a bit better.  His blood  pressures continued to remain quite hypotensive with a systolic in the 90s-  100s.  The plan was made for the patient's Toprol to be held and his Cozaar  to be decreased to 25 mg from 50 mg and to hold his  amiodarone as well.  The  patient was doing quite well otherwise and the plan was for him to be  discharged later that day with the instructions of continuing to hold his  Cozaar and his amiodarone until seen in clinic later the following week.  He  was also instructed to decrease his Toprol from 50 mg to 25 mg a day.  He  was instructed to continue his Lipitor and aspirin as prior to admission.   PHYSICAL EXAM AT TIME OF DISCHARGE:  The patient's physical exam at the time  of discharge was as follows:  His heart was in a regular rate and rhythm.  His telemetry was reading normal sinus rhythm.  His blood pressure was 90 to  100 over 40 to 50.  His heart rate was 58-64 and regular, his respirations  were 20 and unlabored, and SpO2 was 98% on room air.  The patient had a  urine output of 1375 over 24 hours.  His lungs were clear to auscultation  throughout.  His abdomen was benign.  He had trace pedal edema and 2+  posterior tibial pulses.  His right neck incision was healing well from the  carotid endarterectomy.  The patient was neurologically intact.  The patient  was then discharged to home in stable condition on March 12, 2003.   DISCHARGE MEDICATIONS:  1. Lipitor 40 mg daily.  2. Aspirin 325 mg daily.  3. Toprol-XL 25 mg daily (which is a decrease from 50 mg).  4. The patient is to continue to hold his Cozaar 50 mg a day and amiodarone     200 mg a day until seen in [facility] clinic by Dr. Tyrone Sage next week).  5. The patient is to take Tylox 1 to 2 tablets every 4-6 hours as needed for     pain.   ALLERGIES:  No known drug allergies.   DISCHARGE INSTRUCTIONS:  1. Activity:  The patient should avoid driving.  He should avoid heavy     lifting or strenuous activity.  He should continue to walk daily.  2. Diet:  The patient is to follow a low-fat, low-cholesterol, well-balanced     diet. 3. Wound care:  The patient may shower starting March 12, 2003.  He should     wash his  incisions daily with soap and water.  4. Special instructions:  The patient should notify [facility] if he has any     increased redness, swelling or drainage from his incision site or has     temperature of greater than 101 degrees.   FOLLOWUP APPOINTMENTS:  1. Dr. Darrick Penna in 3 weeks.  The [facility] office will call the patient with the     date and time.  2. The patient is to continue as scheduled which is to see Dr. Tyrone Sage in     clinic next week for postoperative evaluation from his coronary artery     bypass grafting surgery.      Carolyn A. Eustaquio Boyden.                  Janetta Hora. Fields, MD    CAF/MEDQ  D:  04/26/2003  T:  04/27/2003  Job:  [id number]   cc:   Janetta Hora. Darrick Penna, MD  [address], [place] [address]   [name], M.D.  [address]  [place], [place] [address]  Fax: [phone]

## 2010-07-26 NOTE — H&P (Signed)
NAME:  CHUCK, CABAN                         ACCOUNT NO.:  [id number]   MEDICAL RECORD NO.:  [id number]                   PATIENT TYPE:  INP   LOCATION:  2302                                 FACILITY:  [facility]   PHYSICIAN:  Di Kindle. Edilia Bo, M.D.        DATE OF BIRTH:  1945-08-20   DATE OF ADMISSION:  04/01/2003  DATE OF DISCHARGE:                                HISTORY & PHYSICAL   REASON FOR ADMISSION:  Abscess of the right neck, status post right carotid  endarterectomy.   HISTORY:  This is a 65 year old gentleman who had a symptomatic right  carotid stenosis.  He had undergone coronary revascularization by Dr.  Tyrone Sage on February 06, 2003.  The patient explains that he had some  episodes of right eye amaurosis fugax postoperatively and on March 10, 2003, underwent right carotid endarterectomy with Dacron patch angioplasty  by Dr. Darrick Penna.  Of note, the plaque extended very high in reviewing the  operative report, and this required extensive mobilization of the  hypoglossal nerve in order to get distal control.  A Dacron patch was  placed.  The patient had been doing well at home until last night.  He  developed sudden swelling in the right neck with erythema.  He went to the  emergency room at [facility], and I was consulted there.  There he had  significant swelling in the neck, and CT confirmed an abscess in the right  neck.  He was transferred to [facility] for further treatment.   The patient denies any history of TIAs, expressive or receptive aphasia, or  amaurosis fugax since his discharge after his right carotid endarterectomy.   PAST MEDICAL HISTORY:  1. Myocardial infarction in 2000, and he has undergone previous angioplasty     and stenting.  In addition, he had coronary artery bypass grafting as     described on February 06, 2003, with vein taken from the right leg.  2. The patient has hypertension.  3. Hypercholesterolemia.  4. The patient denies any history  of diabetes or any history of congestive     heart failure.   MEDICATIONS ON ADMISSION:  1. Lipitor 40 mg p.o. daily.  2. Toprol XL 25 mg p.o. daily.  3. Oxycodone one to two q.4h. p.r.n.  4. Enteric-coated aspirin 325 mg p.o. daily.   The patient has no known drug allergies.   FAMILY HISTORY:  There is no history of premature cardiovascular disease.  Father did have a stroke and a heart attack at an elderly age, and his  mother had a stroke at an elderly age.   SOCIAL HISTORY:  He is married with two children.  He is an Airline pilot.  He  does not use tobacco and has never used tobacco.   REVIEW OF SYSTEMS:  He has had no recent weight loss, weight gain, and no  problems with his appetite.  He did have a fever tonight.  He has had no  chest pain, chest pressure, palpitations, orthopnea, or dyspnea on exertion.  He has had no bronchitis, asthma, or wheezing.  He has had no recent change  in his bowel habits and has no history of peptic ulcer disease.  He has had  no dysuria or frequency.  He denies any history of claudication, rest pain,  or nonhealing ulcers.  Review of systems is completely unremarkable with the  exception of his fever.   PHYSICAL EXAMINATION:  VITAL SIGNS:  Temperature at [facility] was 102,  blood pressure 120/60, heart rate is 88.  HEENT:  He has moderate swelling in the right neck with some mild erythema.  There is no drainage from the incision.  I do not detect any carotid bruits.  CHEST:  Lungs are clear bilaterally to auscultation.  CARDIAC:  He has a regular rate and rhythm.  ABDOMEN:  Soft and nontender.  EXTREMITIES:  He has palpable femoral, popliteal, and pedal pulses  bilaterally.  NEUROLOGIC:  Nonfocal.   IMPRESSION:  This patient's CT scan shows an abscess in the right neck.  There does not appear to be any pseudoaneurysm or disruption of the patch  that I can tell, although certainly the infection could be adjacent to the  patch.  He is being  admitted for IV antibiotics and will need exploration of  his neck and at the very least incision and drainage of his abscess.  If the  patch appears to be involved, then we will have to replace the Dacron patch  with a vein patch or potentially replace the artery with an interposition  vein graft.  Of note, I think this surgery will be significantly higher-risk  given the very high dissection that was required last time and the fact that  we may very well need to replace the artery depending on how extensive the  infection is.  I have discussed the potential risks and complications with  the patient and his wife, who I spoke to over the phone.  The potential  complications include but not limited to wound healing problems, bleeding,  stroke, nerve injury, MI, or other unpredictable medical problems.  All of  his questions were answered.  He will be started on IV Rocephin for now  pending culture results.                                                Di Kindle. Edilia Bo, M.D.    CSD/MEDQ  D:  04/01/2003  T:  04/01/2003  Job:  [id number]

## 2010-07-26 NOTE — Op Note (Signed)
NAME:  Andre Warren, Andre Warren                         ACCOUNT NO.:  [id number]   MEDICAL RECORD NO.:  [id number]                   PATIENT TYPE:  INP   LOCATION:  2304                                 FACILITY:  [facility]   PHYSICIAN:  Gwenith Daily. Tyrone Sage, M.D.            DATE OF BIRTH:  1945/12/11   DATE OF PROCEDURE:  02/06/2003  DATE OF DISCHARGE:                                 OPERATIVE REPORT   PREOPERATIVE DIAGNOSIS:  Coronary occlusive disease.   POSTOPERATIVE DIAGNOSIS:  Coronary occlusive disease.   PROCEDURE:  Coronary artery bypass grafting x3 with the left internal  mammary artery to the 1st obtuse marginal coronary artery, right internal  mammary artery as a pedicle graft  to the left anterior descending coronary  artery and reverse saphenous vein graft to the posterior descending coronary  artery with endoscopic vein harvesting.   SURGEON:  Gwenith Daily. Tyrone Sage, M.D.   FIRST ASSISTANT:  Toribio Harbour, N.P.   INDICATIONS FOR PROCEDURE:  The patient is a 65 year old male who presented  in 2000 with acute inferior  myocardial infarction and underwent angioplasty  of the right coronary artery by Dr. Chales Abrahams. The patient had done well until  recently he had recurrent chest pain and underwent  cardiac catheterization  by Dr. Aleen Campi which demonstrated a total occlusion of the right coronary  artery with greater than 80% stenosis of the LAD and proximal  circumflex  with a moderate sized 1st obtuse marginal supplying the lateral  wall.  Because of the patient's symptoms and critical anatomy, coronary artery  bypass grafting was recommended.   He also had asymptomatic carotid obstructive disease with greater than 80%  stenosis in the right carotid artery and 50% to 60% on the left. In  consultation with Dr. Darrick Penna I decided to proceed with bypass surgery and  address the carotid disease at a later time.   DESCRIPTION OF PROCEDURE:  With Swann-Ganz and arterial line monitors in  place, the patient underwent general endotracheal anesthesia without  incident. The skin  of the chest and legs was prepped with Betadine and  draped in the usual sterile manner.   A median sternotomy was performed. The left internal mammary artery was  dissected down as a pedicle graft. The distal artery was found and had good  free flow. The vessel was hydrostatically dilated with heparinized saline.   Attention was then turned to the right internal mammary artery which was  also dissected down as a pedicle graft. The distal  artery was divided and  had good free flow. It was also hydrostatically dilated with heparinized  saline. Using the Guidant endovein harvesting system, a segment of vein was  harvested from the right thigh. The pericardium was opened. The patient was  systemically heparinized. The ascending aorta and the right atrium were  cannulated. An aortic root vent cardioplegia needle was introduced into the  ascending aorta. The patient was placed  on cardiopulmonary bypass, 2.4  liters per minute per meter squared. The sites of anastomosis were selected  and dissected out of the epicardium.   The right internal mammary artery was not of sufficient length to reach the  obtuse marginal through the transverse sinus. It was elected to place the 2  pedicle arterial grafts, the right as a pedicle to the LAD and the left to  the obtuse marginal. The right coronary artery was totally occluded,  although there was  obvious posterior descending bypass. The patient's body  temperature  was cooled to 32 degrees. An aortic cross clamp  was applied  and 500 mL of cold blood potassium cardioplegia was administered to the  aortic root with rapid diastolic arrest of the heart.   Attention was turned first to the posterior descending coronary artery which  was opened and was a diffusely diseased vessel but it admitted a 1.5-mm  probe at the site of the anastomosis. Using a running 7-0  Prolene, a distal  anastomosis was performed with a 2nd reverse saphenous vein graft to the  posterior descending artery. Through a slit in the left pericardium, the  left internal mammary artery was passed and was anastomosed to the obtuse  marginal coronary artery with a running 8-0 Prolene. The fascia was tacked  to the epicardium. The right internal mammary artery was sufficient length  to reach to the LAD.   Prior to this with the aortic cross clamp still in place. A single punch  aortotomy was created in the ascending aorta and the vein graft to the  posterior descending coronary artery was anastomosed to the ascending aorta  and left open to continue to vent through the aortic root. The right  internal mammary artery as a pedicle graft  was then anastomosed to the left  anterior descending coronary artery with a running 8-0 Prolene.   With release of Edwards bulldog on the mammary artery there was appropriate  rise in myocardial septal temperature. The aortic cross clamp was removed  with a total cross clamp  time of 69 minutes. Air was evacuated from the  vein graft and the proximal  anastomosis was completed. The patient  spontaneously converted to a sinus rhythm. Each of the anastomoses were  inspected and were fully ablated.   The body temperature  was rewarmed to 37 degrees. He was then ventilated and  weaned from cardiopulmonary bypass without difficulty. He remained  hemodynamically stable. He was decannulated in the usual fashion. Protamine  sulfate was administered with the operative field hemostatic. Two atrial and  two ventricular pacing wires were applied. Graft  markers  were applied. A  left pleural tube and a right pleural tube were left in place. Two  mediastinal tubes were left in place.   The sternum was closed with #6 stainless steel wire. The fascia was closed with interrupted 0 Vicryl and running 3-0 Vicryl in the subcutaneous tissue  and a 4-0 subcuticular  stitch in the skin edges. A dry dressing was applied.  Sponge and needle  count was reported as correct at the completion of the  procedure.   The patient tolerated the procedure without obvious complications. He was  transferred to the surgical intensive care unit for further postoperative  care.  Gwenith Daily Tyrone Sage, M.D.    EBG/MEDQ  D:  02/06/2003  T:  02/06/2003  Job:  [id number]   cc:   Aram Candela. Aleen Campi, M.D.  [address]  [place]  [place] [address]  Fax: [phone]

## 2010-07-26 NOTE — Consult Note (Signed)
NAME:  Andre Warren, Andre Warren                         ACCOUNT NO.:  [id number]   MEDICAL RECORD NO.:  [id number]                   PATIENT TYPE:  OIB   LOCATION:                                       FACILITY:  [facility]   PHYSICIAN:  Mark E. Karin Golden, M.D.                DATE OF BIRTH:  29-Sep-1945   DATE OF CONSULTATION:  DATE OF DISCHARGE:  01/31/2003                                   CONSULTATION   REFERRING PHYSICIAN:  Dr. Janetta Hora. Fields.   HISTORY:  Coronary artery disease.  Patient with carotid disease as well.  Presurgical evaluation.   REASON FOR CONSULTATION:  This is a consultation for interpretation of  intracranial views from a cerebral arteriogram done on January 31, 2003 by  Dr. Darrick Penna.   RIGHT INTERNAL CAROTID ARTERIOGRAM:  This vessel was opacified via a common  carotid injection.  Flow from this injection goes only to the right middle  cerebral artery territory, the A-1 segment on this side being hypoplastic.  There is no stenosis, aneurysm or vascular malformation.   The left side was not studied.   IMPRESSION:  The right internal carotid artery supplies only the right  middle cerebral artery territory.  Presumably, the right A-1 segment is  hypoplastic and any supply to the right anterior cerebral artery must be  derived from the left carotid circulation.                                               Mark E. Karin Golden, M.D.    MES/MEDQ  D:  09/08/2003  T:  09/09/2003  Job:  [id number]

## 2010-07-26 NOTE — Discharge Summary (Signed)
NAME:  Andre Warren, Andre Warren                         ACCOUNT NO.:  [id number]   MEDICAL RECORD NO.:  [id number]                   PATIENT TYPE:  INP   LOCATION:  2009                                 FACILITY:  [facility]   PHYSICIAN:  Janetta Hora. Fields, MD               DATE OF BIRTH:  October 02, 1945   DATE OF ADMISSION:  04/01/2003  DATE OF DISCHARGE:  04/06/2003                                 DISCHARGE SUMMARY   ADMIT DIAGNOSIS:  Abscess of right neck status post right carotid  endarterectomy.   PAST MEDICAL HISTORY:  1. Myocardial infarction in 2000 status post angioplasty and stenting.  2. Coronary artery bypass grafting February 06, 2003.  3. Hypertension.  4. Hypercholesterolemia.  5. Carotid stenosis status post right carotid endarterectomy.   ALLERGIES:  No known drug allergies.   DISCHARGE DIAGNOSIS:  Abscess of the right neck wound status post right  carotid endarterectomy. Cultures were positive for strep pyogenes.   BRIEF HISTORY:  This is a 65 year old male who had a symptomatic right  carotid stenosis.  The patient had undergone coronary revascularization by  Dr. Tyrone Sage on February 06, 2003.  The patient stated that he had had some  episodes of right eye amaurosis fugax postoperatively and on February 28, 2003, underwent right carotid endarterectomy with Dacron patch angioplasty  by Dr. Darrick Penna.  The plaque extended very high in reviewing the operative  report, and this required extensive mobilization of the hypoglossal nerve in  order to get distal control.  A Dacron patch was placed.  The patient had  been doing well at home until the night prior to admission on April 01, 2003.  Patient  developed a sudden swelling in the right neck with erythema.  The patient then presented to the emergency room at [facility], and Dr.  Jaynie Collins was consulted there.  The patient had significant swelling  in the neck, and a CT confirmed an abscess of the right neck.  The  patient  was then transferred to [facility] for further treatment.  The patient denied any  history of TIAs, expressive or receptive aphasia, amaurosis fugax after his  discharge and after his right carotid endarterectomy.  The patient was  admitted for IV antibiotics and exploration of the neck with at the very  least incision and drainage of the abscess.   HOSPITAL COURSE:  The patient was admitted and taken to the OR on April 01, 2003, for incision and drainage of the right neck abscess.  Right neck  abscess fluid was sent for a STAT gram stain and aerobic and anaerobic  cultures.  The patient was hemodynamically stable immediately  postoperatively and was extubated without problems. The patient woke up from  anesthesia neurologically intact. Findings included a localized pocket of  pus with no exposed patch.  The patient was started on IV Rocephin pending  culture results.  Postoperative day 1  the patient's wounds were clean with  no drainage, and there was minimal erythema.  STAT gram stain revealed a few  gram positive cocci in pairs.  The culture was still pending at that point.  The patient was continued on IV vancomycin and Rocephin.  The patient's  postoperative course has been uneventful.  The culture revealed strep  pyogenes.  The patient's antibiotic was changed to IV Ancef.  The patient  was taken back to the OR on April 04, 2003, for irrigation and delayed  primary closure of the right neck wound.  This was done under monitored  anesthesia care.  There was no exposed patch, and there were no  complications.  On postoperative day 2, the patient was without complaint,  and the vital signs were stable, and the patient was afebrile.  The neck was  without erythema and swelling.  There was some serosanguineous drainage  present, and the patient was neurologically intact.  The patient was felt to  be stable for discharge within 1-2 days after continuing IV antibiotics.  The  patient will require home health for wound care to the right neck and  will be sent home on p.o. antibiotics.   LABORATORIES:  CBC on April 05, 2003, white count 5.2, hemoglobin 10.7,  hematocrit 31.7, platelet count 277.  BMP also on April 01, 2003, sodium  134, potassium 3.6, glucose 121, BUN 13, creatinine 1.1.  Abscess culture  obtained on April 01, 2003, revealed a few gram positive cocci in pairs  and group A strep pyogenes was isolated.  Blood cultures were negative.   CONDITION ON DISCHARGE:  Improved.   INSTRUCTIONS:  1. Patient is to resume his home medications of Lipitor 40 mg p.o. q.d.,     Toprol XL 25 mg p.o. q.d. and aspirin 325 mg p.o. q.d.  Patient was given     a prescription for penicillin 500 mg 1 p.o. q.i.d. and Tylox 1-2 p.o. q.4-     6h. p.r.n. pain.  2. Activity.  The patient is to refrain from driving and strenuous activity.  3. Diet.  Low salt, fat and cholesterol.  4. Wound care.  The patient may shower daily, and a home health nurse will     be assigned to the patient to come to his house to clean the wound twice     daily.  5. If the incision becomes red, swollen, painful, or if the patient sees a     pus-like drainage, or if he experiences a fever of 101 degrees Fahrenheit     he is to call CVT at his office [phone].   Patient has a follow-up appointment with Dr. Darrick Penna on Friday, April 21, 2003, at 10:30 a.m.      Pecola Leisure, PA                      Charles E. Fields, MD    AY/MEDQ  D:  04/05/2003  T:  04/06/2003  Job:  [id number]   cc:   Janetta Hora. Fields, MD  [address], [place] [address]

## 2010-07-26 NOTE — Op Note (Signed)
NAME:  Andre Warren, Andre Warren                         ACCOUNT NO.:  [id number]   MEDICAL RECORD NO.:  [id number]                   PATIENT TYPE:  OIB   LOCATION:  2894                                 FACILITY:  [facility]   PHYSICIAN:  Janetta Hora. Fields, MD               DATE OF BIRTH:  April 30, 1945   DATE OF PROCEDURE:  01/30/2003  DATE OF DISCHARGE:                                 OPERATIVE REPORT   OPERATION PERFORMED:  Carotid angiogram with arch aortogram.   PREOPERATIVE DIAGNOSIS:  Asymptomatic right internal carotid artery  stenosis.   POSTOPERATIVE DIAGNOSIS:  Asymptomatic right internal carotid artery  stenosis.   SURGEON:  Janetta Hora. Fields, MD   ANESTHESIA:   INDICATIONS FOR PROCEDURE:  The patient is under preoperative assessment for  coronary artery bypass grafting.  Preoperative duplex examination shows a  right carotid lesion greater than 80% and a left carotid lesion between 60  and 80%.  The patient presents for carotid angiogram to further define the  degree of stenosis for consideration for combined carotid coronary artery  bypass grafting operation.  The patient was informed of risks of angiography  including allergic reaction, contrast reaction, stroke, bleeding and  infection.   DESCRIPTION OF PROCEDURE:  After obtaining informed consent, the patient was  taken to the angio suite.  The patient was placed in supine position on the  angio table.  Next, the patient's right groin was prepped and draped in the  usual sterile fashion.  A local anesthetic was infiltrated over the area of  the right common femoral artery.  A Majestic needle was then used to  cannulate the right common femoral artery.  A 0.035 Wholey wire was then  advanced into the aorta using a modified Seldinger technique.  A 5 French  short sheath  was then threaded over the [facility] wire into the artery.  Next,  the [facility] wire and a 5 French pigtail catheter were advanced as a unit up  into the aortic  arch.  An arch aortogram was then obtained which showed the  innominate coming off normal position.  The right common carotid artery  origin and right subclavian origins are widely patent.  The right vertebral  origin is widely patent.  The left common carotid artery comes off at a very  acute angle off of the innominate artery.  The origin of the left common  carotid artery was widely patent. The left subclavian comes off of the arch  in normal position.  The origin of the left subclavian was widely patent.  The left vertebral artery was also widely patent.  Next, the pigtail  catheter was removed over a guidewire and the Wholey wire was advanced into  the innominate artery and then into the right common carotid artery.  A JR2  catheter was used to selectively cannulate the right common carotid artery.  AP and lateral views of the right  common carotid and right cerebral  circulation were performed.  The cerebral portion of the examination will be  dictated by the neuroradiologist.   In the AP projection, the right common carotid artery is widely patent.  The  internal and external carotid arteries fill normally.  There  are no  significant stenoses in the AP projection.  However, in the lateral  projection, the right internal carotid artery has a severe stenosis which is  about 75%.  The external carotid artery is widely patent.   Next, the catheter was pulled back down into the innominate artery.  Multiple attempts were made to cannulate the left common carotid artery  unsuccessfully.  Therefore, the [facility] wire was pulled back and an angled  Glide wire was advanced into the aortic arch.  A CK catheter was then  threaded over the Glide wire into the innominate artery and the catheter was  reformed and advanced into the origin of the left common carotid artery.  Due to the acute angle, the catheter was not advanced distally into the  common carotid artery and the selective injection of  the left common carotid  artery was performed just at the orifice of the left common carotid artery.  In the lateral projection, the left internal carotid artery is approximately  50% stenosed.  The external carotid artery is widely patent.  In the AP  projection, there is no significant internal carotid artery stenosis.  Next  the guidewire was placed back through the CK catheter and the guidewire was  used to straighten the catheter in the descending aorta and the guidewire  and catheter were removed as a unit.  The 5 French sheath was then removed  and hemostasis was obtained with direct pressure.   FINDINGS:  1. A 75% right internal carotid artery stenosis.  2. Bovine arch anatomy.  3. 60% stenosis of the left internal carotid artery.                                               Janetta Hora. Fields, MD    CEF/MEDQ  D:  01/31/2003  T:  01/31/2003  Job:  [id number]

## 2010-12-09 LAB — ETHANOL: Alcohol, Ethyl (B): 137 — ABNORMAL HIGH

## 2010-12-09 LAB — SALICYLATE LEVEL: Salicylate Lvl: <4

## 2010-12-09 LAB — COMPREHENSIVE METABOLIC PANEL
ALT: 17
AST: 24
Albumin: 4.1
Alkaline Phosphatase: 76
BUN: 12
CO2: 25
Calcium: 9.3
Chloride: 106
Creatinine, Ser: 1.03
GFR calc Af Amer: >60
GFR calc non Af Amer: >60
Glucose, Bld: 104 — ABNORMAL HIGH
Potassium: 3.8
Sodium: 139
Total Bilirubin: 1
Total Protein: 7.5

## 2010-12-09 LAB — CBC
HCT: 46.7
Hemoglobin: 15.9
MCHC: 34.1
MCV: 93.9
Platelets: 213
RBC: 4.98
RDW: 13.3
WBC: 16.6 — ABNORMAL HIGH

## 2010-12-09 LAB — RAPID URINE DRUG SCREEN, HOSP PERFORMED
Amphetamines: NOT DETECTED
Barbiturates: NOT DETECTED
Benzodiazepines: NOT DETECTED
Cocaine: NOT DETECTED
Opiates: NOT DETECTED
Tetrahydrocannabinol: NOT DETECTED

## 2010-12-09 LAB — ACETAMINOPHEN LEVEL: Acetaminophen (Tylenol), Serum: <10 — ABNORMAL LOW

## 2010-12-09 LAB — DIFFERENTIAL
Basophils Absolute: 0
Basophils Relative: 0
Eosinophils Absolute: 0
Eosinophils Relative: 0
Lymphocytes Relative: 5 — ABNORMAL LOW
Lymphs Abs: 0.9
Monocytes Absolute: 0.1
Monocytes Relative: 1 — ABNORMAL LOW
Neutro Abs: 15.5 — ABNORMAL HIGH
Neutrophils Relative %: 94 — ABNORMAL HIGH

## 2010-12-25 LAB — I-STAT 8, (EC8 V) (CONVERTED LAB)
BUN: 18
Chloride: 104
Glucose, Bld: 131 — ABNORMAL HIGH
Hemoglobin: 18 — ABNORMAL HIGH
Potassium: 4.1
Sodium: 133 — ABNORMAL LOW
TCO2: 23
pH, Ven: 7.466 — ABNORMAL HIGH

## 2010-12-25 LAB — DIFFERENTIAL
Basophils Relative: 0
Eosinophils Absolute: 0
Eosinophils Relative: 0
Lymphs Abs: 0.8
Monocytes Relative: 5
Neutrophils Relative %: 89 — ABNORMAL HIGH

## 2010-12-25 LAB — HEPATIC FUNCTION PANEL
AST: 23
Albumin: 4.3
Total Protein: 7.8

## 2010-12-25 LAB — POCT CARDIAC MARKERS
Myoglobin, poc: 102
Myoglobin, poc: 110
Operator id: 277751
Operator id: 277751

## 2010-12-25 LAB — APTT: aPTT: 29

## 2010-12-25 LAB — CBC
HCT: 48.9
MCHC: 33.8
MCV: 93.5
Platelets: 261
Platelets: 267
RBC: 4.36
WBC: 13.5 — ABNORMAL HIGH
WBC: 7.1

## 2010-12-25 LAB — B-NATRIURETIC PEPTIDE (CONVERTED LAB): Pro B Natriuretic peptide (BNP): 35

## 2010-12-25 LAB — POCT I-STAT CREATININE: Operator id: 277751

## 2010-12-25 LAB — LIPID PANEL
HDL: 39 — ABNORMAL LOW
Triglycerides: 64
VLDL: 13

## 2010-12-25 LAB — AMYLASE: Amylase: 43

## 2010-12-25 LAB — PROTIME-INR
INR: 1.1
Prothrombin Time: 13.9

## 2010-12-25 LAB — TSH: TSH: 2.562

## 2010-12-25 LAB — LIPASE, BLOOD: Lipase: 17

## 2011-08-08 ENCOUNTER — Encounter: Payer: Self-pay | Admitting: Gastroenterology

## 2012-03-10 DIAGNOSIS — S065XAA Traumatic subdural hemorrhage with loss of consciousness status unknown, initial encounter: Secondary | ICD-10-CM

## 2012-03-10 DIAGNOSIS — S065X9A Traumatic subdural hemorrhage with loss of consciousness of unspecified duration, initial encounter: Secondary | ICD-10-CM

## 2012-03-10 HISTORY — DX: Traumatic subdural hemorrhage with loss of consciousness of unspecified duration, initial encounter: S06.5X9A

## 2012-03-10 HISTORY — DX: Traumatic subdural hemorrhage with loss of consciousness status unknown, initial encounter: S06.5XAA

## 2012-03-14 ENCOUNTER — Inpatient Hospital Stay (HOSPITAL_COMMUNITY)
Admission: EM | Admit: 2012-03-14 | Discharge: 2012-03-17 | DRG: 086 | Disposition: A | Discharge status: Home / Self Care | Payer: Medicare Other | Attending: Family Medicine | Admitting: Family Medicine

## 2012-03-14 ENCOUNTER — Encounter (HOSPITAL_COMMUNITY): Payer: Self-pay | Admitting: *Deleted

## 2012-03-14 ENCOUNTER — Emergency Department (HOSPITAL_COMMUNITY): Payer: Medicare Other

## 2012-03-14 DIAGNOSIS — W11XXXA Fall on and from ladder, initial encounter: Secondary | ICD-10-CM | POA: Diagnosis present

## 2012-03-14 DIAGNOSIS — Z8249 Family history of ischemic heart disease and other diseases of the circulatory system: Secondary | ICD-10-CM

## 2012-03-14 DIAGNOSIS — E785 Hyperlipidemia, unspecified: Secondary | ICD-10-CM | POA: Diagnosis present

## 2012-03-14 DIAGNOSIS — S065X0A Traumatic subdural hemorrhage without loss of consciousness, initial encounter: Principal | ICD-10-CM | POA: Diagnosis present

## 2012-03-14 DIAGNOSIS — R202 Paresthesia of skin: Secondary | ICD-10-CM

## 2012-03-14 DIAGNOSIS — Z823 Family history of stroke: Secondary | ICD-10-CM

## 2012-03-14 DIAGNOSIS — I6203 Nontraumatic chronic subdural hemorrhage: Secondary | ICD-10-CM

## 2012-03-14 DIAGNOSIS — I161 Hypertensive emergency: Secondary | ICD-10-CM

## 2012-03-14 DIAGNOSIS — Z7982 Long term (current) use of aspirin: Secondary | ICD-10-CM

## 2012-03-14 DIAGNOSIS — Z9861 Coronary angioplasty status: Secondary | ICD-10-CM

## 2012-03-14 DIAGNOSIS — I10 Essential (primary) hypertension: Secondary | ICD-10-CM | POA: Diagnosis present

## 2012-03-14 DIAGNOSIS — Z951 Presence of aortocoronary bypass graft: Secondary | ICD-10-CM

## 2012-03-14 DIAGNOSIS — I2510 Atherosclerotic heart disease of native coronary artery without angina pectoris: Secondary | ICD-10-CM | POA: Diagnosis present

## 2012-03-14 HISTORY — DX: Atherosclerotic heart disease of native coronary artery without angina pectoris: I25.10

## 2012-03-14 HISTORY — DX: Essential (primary) hypertension: I10

## 2012-03-14 HISTORY — DX: Pure hypercholesterolemia, unspecified: E78.00

## 2012-03-14 LAB — COMPREHENSIVE METABOLIC PANEL
BUN: 16 mg/dL (ref 6–23)
Calcium: 9.9 mg/dL (ref 8.4–10.5)
GFR calc Af Amer: 88 mL/min — ABNORMAL LOW (ref >90)
GFR calc non Af Amer: 76 mL/min — ABNORMAL LOW (ref >90)
Glucose, Bld: 120 mg/dL — ABNORMAL HIGH (ref 70–99)
Total Protein: 7.3 g/dL (ref 6.0–8.3)

## 2012-03-14 LAB — DIFFERENTIAL
Eosinophils Absolute: 0.3 10*3/uL (ref 0.0–0.7)
Eosinophils Relative: 4 % (ref 0–5)
Lymphs Abs: 2.5 10*3/uL (ref 0.7–4.0)
Monocytes Absolute: 0.7 10*3/uL (ref 0.1–1.0)
Monocytes Relative: 9 % (ref 3–12)

## 2012-03-14 LAB — CBC
HCT: 42.1 % (ref 39.0–52.0)
Hemoglobin: 14.4 g/dL (ref 13.0–17.0)
MCH: 32 pg (ref 26.0–34.0)
MCV: 93.6 fL (ref 78.0–100.0)
Platelets: 215 10*3/uL (ref 150–400)
RBC: 4.5 MIL/uL (ref 4.22–5.81)

## 2012-03-14 LAB — PROTIME-INR: Prothrombin Time: 13.3 seconds (ref 11.6–15.2)

## 2012-03-14 LAB — TROPONIN I: Troponin I: <0.3 ng/mL (ref <0.30)

## 2012-03-14 MED ORDER — LABETALOL HCL 5 MG/ML IV SOLN
20.0000 mg | Freq: Once | INTRAVENOUS | Status: AC
  Administered 2012-03-14: 20 mg via INTRAVENOUS
  Filled 2012-03-14: qty 4

## 2012-03-14 NOTE — ED Notes (Signed)
Pt c/o sudden onset of L facial, L arm, and posterior L leg numbness and sensation of tightness. Pt has cardiac hx w/ HTN, CAD, CABG x 3

## 2012-03-15 ENCOUNTER — Encounter (HOSPITAL_COMMUNITY): Payer: Self-pay | Admitting: *Deleted

## 2012-03-15 DIAGNOSIS — E785 Hyperlipidemia, unspecified: Secondary | ICD-10-CM | POA: Diagnosis present

## 2012-03-15 DIAGNOSIS — I2510 Atherosclerotic heart disease of native coronary artery without angina pectoris: Secondary | ICD-10-CM | POA: Diagnosis present

## 2012-03-15 DIAGNOSIS — R209 Unspecified disturbances of skin sensation: Secondary | ICD-10-CM

## 2012-03-15 DIAGNOSIS — I10 Essential (primary) hypertension: Secondary | ICD-10-CM

## 2012-03-15 DIAGNOSIS — I6200 Nontraumatic subdural hemorrhage, unspecified: Secondary | ICD-10-CM

## 2012-03-15 MED ORDER — LOSARTAN POTASSIUM 25 MG PO TABS
25.0000 mg | ORAL_TABLET | Freq: Every day | ORAL | Status: DC
  Administered 2012-03-15: 25 mg via ORAL
  Filled 2012-03-15 (×2): qty 1

## 2012-03-15 MED ORDER — MAGNESIUM HYDROXIDE 400 MG/5ML PO SUSP: 30.0000 mL | Freq: Every day | ORAL | Status: DC | PRN

## 2012-03-15 MED ORDER — LABETALOL HCL 5 MG/ML IV SOLN: 10.0000 mg | INTRAVENOUS | Status: DC | PRN

## 2012-03-15 MED ORDER — HYDRALAZINE HCL 20 MG/ML IJ SOLN
10.0000 mg | Freq: Three times a day (TID) | INTRAMUSCULAR | Status: DC
  Administered 2012-03-15 – 2012-03-16 (×2): 10 mg via INTRAVENOUS
  Filled 2012-03-15: qty 0.5
  Filled 2012-03-15 (×2): qty 1
  Filled 2012-03-15 (×2): qty 0.5

## 2012-03-15 MED ORDER — ATORVASTATIN CALCIUM 80 MG PO TABS
120.0000 mg | ORAL_TABLET | Freq: Every day | ORAL | Status: DC
  Administered 2012-03-16 – 2012-03-17 (×2): 120 mg via ORAL
  Filled 2012-03-15 (×3): qty 1

## 2012-03-15 MED ORDER — HYDROCODONE-ACETAMINOPHEN 5-325 MG PO TABS
1.0000 | ORAL_TABLET | ORAL | Status: DC | PRN
  Administered 2012-03-15 (×2): 1 via ORAL
  Administered 2012-03-16: 2 via ORAL
  Administered 2012-03-17: 1 via ORAL
  Filled 2012-03-15: qty 2
  Filled 2012-03-15: qty 1
  Filled 2012-03-15: qty 2
  Filled 2012-03-15 (×2): qty 1

## 2012-03-15 MED ORDER — ACETAMINOPHEN 650 MG RE SUPP: 650.0000 mg | Freq: Four times a day (QID) | RECTAL | Status: DC | PRN

## 2012-03-15 MED ORDER — METOPROLOL TARTRATE 25 MG PO TABS
25.0000 mg | ORAL_TABLET | Freq: Two times a day (BID) | ORAL | Status: DC
  Administered 2012-03-16 – 2012-03-17 (×3): 25 mg via ORAL
  Filled 2012-03-15 (×6): qty 1

## 2012-03-15 MED ORDER — HYDROCHLOROTHIAZIDE 10 MG/ML ORAL SUSPENSION
6.2500 mg | Freq: Every day | ORAL | Status: DC
  Administered 2012-03-16: 6.25 mg via ORAL
  Filled 2012-03-15 (×3): qty 1.25

## 2012-03-15 MED ORDER — SODIUM CHLORIDE 0.9 % IJ SOLN
3.0000 mL | Freq: Two times a day (BID) | INTRAMUSCULAR | Status: DC
  Administered 2012-03-15 – 2012-03-17 (×5): 3 mL via INTRAVENOUS

## 2012-03-15 MED ORDER — BISOPROLOL-HYDROCHLOROTHIAZIDE 5-6.25 MG PO TABS: 1.0000 | ORAL_TABLET | Freq: Every day | ORAL | Status: DC

## 2012-03-15 MED ORDER — ACETAMINOPHEN 325 MG PO TABS
650.0000 mg | ORAL_TABLET | Freq: Four times a day (QID) | ORAL | Status: DC | PRN
  Administered 2012-03-16: 650 mg via ORAL
  Filled 2012-03-15: qty 2

## 2012-03-15 MED ORDER — ONDANSETRON HCL 4 MG PO TABS: 4.0000 mg | ORAL_TABLET | Freq: Four times a day (QID) | ORAL | Status: DC | PRN

## 2012-03-15 MED ORDER — BISOPROLOL FUMARATE 5 MG PO TABS
5.0000 mg | ORAL_TABLET | Freq: Every day | ORAL | Status: DC
  Administered 2012-03-16 – 2012-03-17 (×2): 5 mg via ORAL
  Filled 2012-03-15 (×3): qty 1

## 2012-03-15 MED ORDER — HYDRALAZINE HCL 20 MG/ML IJ SOLN
10.0000 mg | Freq: Three times a day (TID) | INTRAMUSCULAR | Status: DC | PRN
  Administered 2012-03-15: 10 mg via INTRAVENOUS
  Filled 2012-03-15: qty 1

## 2012-03-15 MED ORDER — ONDANSETRON HCL 4 MG/2ML IJ SOLN
4.0000 mg | Freq: Four times a day (QID) | INTRAMUSCULAR | Status: DC | PRN
  Filled 2012-03-15: qty 2

## 2012-03-15 NOTE — ED Provider Notes (Signed)
History     CSN: [id number]  Arrival date & time 03/14/12  2202   First MD Initiated Contact with Patient 03/14/12 2304      Chief Complaint  Patient presents with  . Numbness    HPI Patient reports developing left arm and leg numbness with a sensation of tightness that began acutely at 8:30 PM.  His history hypertension coronary artery disease as well as history of CABG.  He's never had a stroke before.  He noticed his blood pressure is elevated at home with systolic greater than 200.  The patient's also had a carotid endarterectomy on his right carotid.  He reports he did have a fall 3 months ago at which point he fell off a ladder and struck the back of his head hard.  He's not on any anticoagulants.  After that trauma to his head he did have some dizziness for several weeks but has since resolved.  He has no other complaints at this time.  He denies weakness of his upper lower extremities.  No difficulty with speech.  No facial droop.  No recent chest pain.  He denies headache at this time.  No changes in his vision.   Past Medical History  Diagnosis Date  . CAD (coronary artery disease)   . Hypertension   . Hypercholesteremia     Past Surgical History  Procedure Date  . Coronary artery bypass graft     x 3  . Coronary angioplasty with stent placement     Became occluded and resulted in CABG    History reviewed. No pertinent family history.  History  Substance Use Topics  . Smoking status: Never Smoker   . Smokeless tobacco: Not on file  . Alcohol Use: No      Review of Systems  All other systems reviewed and are negative.    Allergies  Latex  Home Medications   Current Outpatient Rx  Name  Route  Sig  Dispense  Refill  . ASPIRIN 325 MG PO TABS   Oral   Take 325 mg by mouth daily.         . ATORVASTATIN CALCIUM 80 MG PO TABS   Oral   Take 120 mg by mouth daily. Takes one and half tab qhs.         Marland Kitchen BISOPROLOL-HYDROCHLOROTHIAZIDE 5-6.25 MG PO  TABS   Oral   Take 1 tablet by mouth daily.         Marland Kitchen LOSARTAN POTASSIUM 50 MG PO TABS   Oral   Take 25 mg by mouth daily. One half tablet daily         . METOPROLOL TARTRATE 50 MG PO TABS   Oral   Take 25 mg by mouth 2 (two) times daily. Take one half tablet           BP 170/89  Pulse 66  Temp 98.6 F (37 C) (Oral)  Resp 15  Ht 5\' 8"  (1.727 m)  Wt 175 lb (79.379 kg)  BMI 26.61 kg/m2  SpO2 97%  Physical Exam  Nursing note and vitals reviewed. Constitutional: He is oriented to person, place, and time. He appears well-developed and well-nourished.  HENT:  Head: Normocephalic and atraumatic.  Eyes: EOM are normal. Pupils are equal, round, and reactive to light.  Neck: Normal range of motion.  Cardiovascular: Normal rate, regular rhythm, normal heart sounds and intact distal pulses.   Pulmonary/Chest: Effort normal and breath sounds normal. No respiratory distress.  Abdominal:  Soft. He exhibits no distension. There is no tenderness.  Musculoskeletal: Normal range of motion.  Neurological: He is alert and oriented to person, place, and time.       5/5 strength in major muscle groups of  bilateral upper and lower extremities. Speech normal. No facial asymetry.  Sensation grossly intact  Skin: Skin is warm and dry.  Psychiatric: He has a normal mood and affect. Judgment normal.    ED Course  Procedures (including critical care time)  CRITICAL CARE Performed by: Lyanne Co Total critical care time: 30 Critical care time was exclusive of separately billable procedures and treating other patients. Critical care was necessary to treat or prevent imminent or life-threatening deterioration. Critical care was time spent personally by me on the following activities: development of treatment plan with patient and/or surrogate as well as nursing, discussions with consultants, evaluation of patient's response to treatment, examination of patient, obtaining history from  patient or surrogate, ordering and performing treatments and interventions, ordering and review of laboratory studies, ordering and review of radiographic studies, pulse oximetry and re-evaluation of patient's condition.   Labs Reviewed  COMPREHENSIVE METABOLIC PANEL - Abnormal; Notable for the following:    Glucose, Bld 120 (*)     GFR calc non Af Amer 76 (*)     GFR calc Af Amer 88 (*)     All other components within normal limits  PROTIME-INR  APTT  CBC  DIFFERENTIAL  TROPONIN I  GLUCOSE, CAPILLARY   Ct Head (brain) Wo Contrast  03/14/2012  *RADIOLOGY REPORT*  Clinical Data: Pain in calf. Numbness.  CT HEAD WITHOUT CONTRAST  Technique:  Contiguous axial images were obtained from the base of the skull through the vertex without contrast.  Comparison: None.  Findings: Bilateral hypodense extra-axial fluid collections are seen, left much greater than right measuring 21 mm thick over the left convexity and 13 mm thick over the right convexity. The left subdural collection extends over a much larger area from anterior- posterior, whereas the right subdural collection is more focal localized to the right parietal lobe.  Although contrast was not administered, there appears to be a medial surrounding hyperdense structure, likely representing a chronic subdural membrane.  There is minimal subfalcine shift at the level of the corpus callosum approximately 3 mm.  No acute stroke.  No hydrocephalus.  Probable mild atrophy is obscured by the intracranial space-occupying lesions.  No significant white matter disease.  No skull fracture is present. Sinuses and mastoids are clear.  IMPRESSION: Bilateral chronic subdural hematomas, much larger on the left. Minimal left to right shift.  Moderate compression of the underlying brain on the left side.  Critical Value/emergent results were called by telephone at the time of interpretation on 03/14/12 at 10:59 p.m. to Dr. Ethelda Chick, who verbally acknowledged these  results.   Original Report Authenticated By: Davonna Belling, M.D.    I personally reviewed the imaging tests through PACS system I reviewed available ER/hospitalization records through the EMR   1. Hypertensive emergency   2. Chronic subdural hematoma     Date: 03/15/2012  Rate: 91  Rhythm: normal sinus rhythm  QRS Axis: normal  Intervals: normal  ST/T Wave abnormalities: normal  Conduction Disutrbances: none  Narrative Interpretation:   Old EKG Reviewed: No significant changes noted      MDM  The patient has large left-sided subdural as well as a small right-sided subdural.  I doubt that his left sided chronic subdural is causing his left-sided symptoms.  The patient is alert nourished x3.  He is in no acute distress.  A consideration for stroke has to be made for his left-sided numbness.  The patient is not a candidate for TPA given his intracranial chronic bleed that is present.  The patient's case was discussed with neurosurgery who agrees that nothing acutely needs to be done.  Dr. Danielle Dess will consult on the patient in the hospital tomorrow morning but he does recommend transfer to [facility].  He has personally evaluated the CT images from home  The patient will likely benefit from neuro hospitalist consult on arrival to [facility].  I spoke with the hospitalist Dr. Lendell Caprice who agrees to evaluate the patient in the emergency department.  The patient presented with hypertensive urgency of 223/101.  After 10 mg of IV labetalol the patient has reduced his blood pressure 170/90.  I would not want to reduce his blood pressure any more than that right now.        Lyanne Co, MD 03/15/12 [phone]

## 2012-03-15 NOTE — ED Notes (Signed)
Report given to [facility]. 

## 2012-03-15 NOTE — ED Notes (Signed)
Report called to RN, Tresa Endo

## 2012-03-15 NOTE — H&P (Signed)
Hospital Admission Note Date: 03/15/2012  Patient name: Andre Warren Medical record number: [id number] Date of birth: 01/24/1946 Age: 67 y.o. Gender: male PCP: Nadean Corwin, MD  Chief Complaint: High blood pressure. Numbness and tingling of the left face, left arm and left leg  History of Present Illness:  Andre Warren is an 67 y.o. male who presents to the [facility] emergency department with sudden onset of left arm numbness at 8:30 tonight. He took his blood pressure and noted that systolic was above 200. His symptoms lasted for a few minutes. Subsequently, he experienced numbness of the left cheek area, and left thigh and calf. These symptoms lasted for about 4 hours. He presented to the emergency room at 10 PM. Initially, his blood pressure was 223/101. CAT scan of the brain without contrast showed bilateral chronic subdural hematomas, left greater than right. Minimal subfalcine shift at the level of the corpus callosum. No acute stroke or hydrocephalus noted. Upon further questioning, patient does report having fallen about 8 feet from a ladder several months ago. He hit his head. He did not lose consciousness. He felt dizzy for several weeks but never sought medical attention. Patient was given 20 mg of labetalol in the emergency room, and blood pressure is now 170/89. Patient reports that his symptoms improved as his blood pressure decreased. The patient has been compliant with his medications and has had no recent changes in medications or diet. He reports that his blood pressure usually runs about 150 systolic. He has a history of right carotid endarterectomy. No stroke. He takes an aspirin a day. The ED physician spoke with Dr. Danielle Dess who recommends transfer to [facility] and he will evaluate the patient in the morning per  Past Medical History  Diagnosis Date  . CAD (coronary artery disease)   . Hypertension   . Hypercholesteremia    right carotid  endarterectomy  Meds: See home medication list  Allergies: Latex History   Social History  . Marital Status: Married    Spouse Name: N/A    Number of Children: N/A  . Years of Education: N/A   Occupational History  . Not on file.   Social History Main Topics  . Smoking status: Never Smoker   . Smokeless tobacco: Not on file  . Alcohol Use: No  . Drug Use: No  . Sexually Active: Yes    Birth Control/ Protection: None   Other Topics Concern  . Not on file   Social History Narrative  . No narrative on file   Family history: Both parents had strokes. His father had heart disease.  Past Surgical History  Procedure Date  . Coronary artery bypass graft     x 3  . Coronary angioplasty with stent placement     Became occluded and resulted in CABG   right carotid endarterectomy  Review of Systems: Systems reviewed and as per HPI, otherwise negative.  Physical Exam: Blood pressure 170/89, pulse 66, temperature 98.6 F (37 C), temperature source Oral, resp. rate 15, height 5\' 8"  (1.727 m), weight 79.379 kg (175 lb), SpO2 97.00%. BP 170/89  Pulse 66  Temp 98.6 F (37 C) (Oral)  Resp 15  Ht 5\' 8"  (1.727 m)  Wt 79.379 kg (175 lb)  BMI 26.61 kg/m2  SpO2 97%  General Appearance:    Alert, cooperative, no distress, appears stated age  Head:    Normocephalic, without obvious abnormality, atraumatic  Eyes:    PERRL, conjunctiva/corneas clear, EOM's  intact, fundi    benign, both eyes       Ears:    Normal TM's and external ear canals, both ears  Nose:   Nares normal, septum midline, mucosa normal, no drainage    or sinus tenderness  Throat:   Lips, mucosa, and tongue normal; teeth and gums normal  Neck:   healed right surgical scar. No carotid bruits. No thyromegaly   Back:     Symmetric, no curvature, ROM normal, no CVA tenderness  Lungs:     Clear to auscultation bilaterally, respirations unlabored  Chest wall:    No tenderness or healed surgical scar   Heart:     Regular rate and rhythm, S1 and S2 normal, no murmur, rub   or gallop  Abdomen:     Soft, non-tender, bowel sounds active all four quadrants,    no masses, no organomegaly  Genitalia:   deferred   Rectal:   deferred   Extremities:   Extremities normal, atraumatic, no cyanosis or edema  Pulses:   2+ and symmetric all extremities  Skin:   Skin color, texture, turgor normal, no rashes or lesions  Lymph nodes:   Cervical, supraclavicular, and axillary nodes normal  Neurologic:   CNII-XII intact. Normal strength, sensation and reflexes      throughout    Psychiatric: Occasionally tearful  Lab results: Basic Metabolic Panel:  Basename 03/14/12 2230  NA 136  K 3.7  CL 101  CO2 23  GLUCOSE 120*  BUN 16  CREATININE 1.01  CALCIUM 9.9  MG --  PHOS --   Liver Function Tests:  Basename 03/14/12 2230  AST 17  ALT 11  ALKPHOS 90  BILITOT 0.7  PROT 7.3  ALBUMIN 4.0   No results found for this basename: LIPASE:2,AMYLASE:2 in the last 72 hours No results found for this basename: AMMONIA:2 in the last 72 hours CBC:  Basename 03/14/12 2230  WBC 7.8  NEUTROABS 4.3  HGB 14.4  HCT 42.1  MCV 93.6  PLT 215   Cardiac Enzymes:  Basename 03/14/12 2230  CKTOTAL --  CKMB --  CKMBINDEX --  TROPONINI <0.30   BNP: No results found for this basename: PROBNP:3 in the last 72 hours D-Dimer: No results found for this basename: DDIMER:2 in the last 72 hours CBG:  Basename 03/14/12 2310  GLUCAP 94   Coagulation:  Basename 03/14/12 2230  LABPROT 13.3  INR 1.02   EKG shows normal sinus rhythm with left atrial abnormality.  Imaging results:  Ct Head (brain) Wo Contrast  03/14/2012  *RADIOLOGY REPORT*  Clinical Data: Pain in calf. Numbness.  CT HEAD WITHOUT CONTRAST  Technique:  Contiguous axial images were obtained from the base of the skull through the vertex without contrast.  Comparison: None.  Findings: Bilateral hypodense extra-axial fluid collections are seen, left much  greater than right measuring 21 mm thick over the left convexity and 13 mm thick over the right convexity. The left subdural collection extends over a much larger area from anterior- posterior, whereas the right subdural collection is more focal localized to the right parietal lobe.  Although contrast was not administered, there appears to be a medial surrounding hyperdense structure, likely representing a chronic subdural membrane.  There is minimal subfalcine shift at the level of the corpus callosum approximately 3 mm.  No acute stroke.  No hydrocephalus.  Probable mild atrophy is obscured by the intracranial space-occupying lesions.  No significant white matter disease.  No skull fracture is present.  Sinuses and mastoids are clear.  IMPRESSION: Bilateral chronic subdural hematomas, much larger on the left. Minimal left to right shift.  Moderate compression of the underlying brain on the left side.  Critical Value/emergent results were called by telephone at the time of interpretation on 03/14/12 at 10:59 p.m. to Dr. Ethelda Chick, who verbally acknowledged these results.   Original Report Authenticated By: Davonna Belling, M.D.     Assessment & Plan: Active Problems:  Paresthesias likely secondary to malignant hypertension in the setting of previously undiagnosed bilateral subdural hematomas. Symptoms have resolved. Transferred to [facility], telemetry unit per the recommendations of Dr. Danielle Dess. He will evaluate the patient tomorrow morning. Will resume home antihypertensives and give labetalol as needed. Hold heparin products and aspirin for now.  Subdural hematoma, with mild subfalcine shift, see above  Malignant hypertension, see above  CAD (coronary artery disease)  Hyperlipidemia  Tatanisha Cuthbert L 03/15/2012, 2:06 AM

## 2012-03-15 NOTE — ED Notes (Signed)
[facility] called for transport.  

## 2012-03-15 NOTE — ED Notes (Signed)
Stroke swallow screen completed at 2314

## 2012-03-15 NOTE — Progress Notes (Signed)
Notified Dr. Izola Price that med rec was not correct when patient admitted. Updated med rec and asked md to address med changes. Emelda Brothers RN

## 2012-03-15 NOTE — Progress Notes (Signed)
Please earlier H&P by Dr. Lendell Caprice. Pt was admitted for evaluation and management of subdural hematoma in the setting of recent fall. He is hemodynamically stable on exam this AM. Neurosurgery following. Symptomatic management and observation for now recommended. BP control. CBC and BMET in AM.  Debbora Presto, MD  [facility] Pager [phone]  If 7PM-7AM, please contact night-coverage [email or web address] Password [email or web address]

## 2012-03-15 NOTE — Consult Note (Signed)
Reason for Consult: Bilateral subdural hematomas, chronic Referring Physician: Dr. Laurina Bustle is an 67 y.o. male.  HPI: Patient is a 67 year old individual who tells me that he had a fall from a ladder approximately 8-10 feet in October he did strike the back of his head quite briskly though he did not have loss of consciousness. He denies any significant history of headache since that time though he did note that after the fall he felt dizzy for approximately 3 weeks and he seemed to be improving however he notes in the last couple of weeks he's had some weakness in his right upper extremity. He also has had some dysesthesias and tingling in the left upper and left lower extremity. This past evening he noted some discomfort and distress in his left arm and also by the left side of his face. He was brought to the emergency room where CT scan demonstrated a presence of a moderate-sized chronic sub-dural hematoma on the right side and a large subdural hematoma on the left side there is minimal left to right shift of 3 mm.  Past Medical History  Diagnosis Date  . CAD (coronary artery disease)   . Hypertension   . Hypercholesteremia     Past Surgical History  Procedure Date  . Coronary artery bypass graft     x 3  . Coronary angioplasty with stent placement     Became occluded and resulted in CABG  . Carotid endarterectomy 2004    History reviewed. No pertinent family history.  Social History:  reports that he has never smoked. He does not have any smokeless tobacco history on file. He reports that he does not drink alcohol or use illicit drugs.  Allergies:  Allergies  Allergen Reactions  . Latex Itching    Medications: Have not reviewed the patient's medications.  Results for orders placed during the hospital encounter of 03/14/12 (from the past 48 hour(s))  PROTIME-INR     Status: Normal   Collection Time   03/14/12 10:30 PM      Component Value Range Comment   Prothrombin Time 13.3  11.6 - 15.2 seconds    INR 1.02  0.00 - 1.49   APTT     Status: Normal   Collection Time   03/14/12 10:30 PM      Component Value Range Comment   aPTT 33  24 - 37 seconds   CBC     Status: Normal   Collection Time   03/14/12 10:30 PM      Component Value Range Comment   WBC 7.8  4.0 - 10.5 K/uL    RBC 4.50  4.22 - 5.81 MIL/uL    Hemoglobin 14.4  13.0 - 17.0 g/dL    HCT 56.2  13.0 - 86.5 %    MCV 93.6  78.0 - 100.0 fL    MCH 32.0  26.0 - 34.0 pg    MCHC 34.2  30.0 - 36.0 g/dL    RDW 78.4  69.6 - 29.5 %    Platelets 215  150 - 400 K/uL   DIFFERENTIAL     Status: Normal   Collection Time   03/14/12 10:30 PM      Component Value Range Comment   Neutrophils Relative 55  43 - 77 %    Neutro Abs 4.3  1.7 - 7.7 K/uL    Lymphocytes Relative 32  12 - 46 %    Lymphs Abs 2.5  0.7 - 4.0 K/uL  Monocytes Relative 9  3 - 12 %    Monocytes Absolute 0.7  0.1 - 1.0 K/uL    Eosinophils Relative 4  0 - 5 %    Eosinophils Absolute 0.3  0.0 - 0.7 K/uL    Basophils Relative 0  0 - 1 %    Basophils Absolute 0.0  0.0 - 0.1 K/uL   COMPREHENSIVE METABOLIC PANEL     Status: Abnormal   Collection Time   03/14/12 10:30 PM      Component Value Range Comment   Sodium 136  135 - 145 mEq/L    Potassium 3.7  3.5 - 5.1 mEq/L    Chloride 101  96 - 112 mEq/L    CO2 23  19 - 32 mEq/L    Glucose, Bld 120 (*) 70 - 99 mg/dL    BUN 16  6 - 23 mg/dL    Creatinine, Ser 1.61  0.50 - 1.35 mg/dL    Calcium 9.9  8.4 - 09.6 mg/dL    Total Protein 7.3  6.0 - 8.3 g/dL    Albumin 4.0  3.5 - 5.2 g/dL    AST 17  0 - 37 U/L    ALT 11  0 - 53 U/L    Alkaline Phosphatase 90  39 - 117 U/L    Total Bilirubin 0.7  0.3 - 1.2 mg/dL    GFR calc non Af Amer 76 (*) >90 mL/min    GFR calc Af Amer 88 (*) >90 mL/min   TROPONIN I     Status: Normal   Collection Time   03/14/12 10:30 PM      Component Value Range Comment   Troponin I <0.30  <0.30 ng/mL   GLUCOSE, CAPILLARY     Status: Normal   Collection Time    03/14/12 11:10 PM      Component Value Range Comment   Glucose-Capillary 94  70 - 99 mg/dL     Ct Head (brain) Wo Contrast  03/14/2012  *RADIOLOGY REPORT*  Clinical Data: Pain in calf. Numbness.  CT HEAD WITHOUT CONTRAST  Technique:  Contiguous axial images were obtained from the base of the skull through the vertex without contrast.  Comparison: None.  Findings: Bilateral hypodense extra-axial fluid collections are seen, left much greater than right measuring 21 mm thick over the left convexity and 13 mm thick over the right convexity. The left subdural collection extends over a much larger area from anterior- posterior, whereas the right subdural collection is more focal localized to the right parietal lobe.  Although contrast was not administered, there appears to be a medial surrounding hyperdense structure, likely representing a chronic subdural membrane.  There is minimal subfalcine shift at the level of the corpus callosum approximately 3 mm.  No acute stroke.  No hydrocephalus.  Probable mild atrophy is obscured by the intracranial space-occupying lesions.  No significant white matter disease.  No skull fracture is present. Sinuses and mastoids are clear.  IMPRESSION: Bilateral chronic subdural hematomas, much larger on the left. Minimal left to right shift.  Moderate compression of the underlying brain on the left side.  Critical Value/emergent results were called by telephone at the time of interpretation on 03/14/12 at 10:59 p.m. to Dr. Ethelda Chick, who verbally acknowledged these results.   Original Report Authenticated By: Davonna Belling, M.D.     Review of Systems  Constitutional:       Weakness in right upper and right lower extremity over past several  HENT:  Negative.   Eyes: Negative.   Respiratory: Negative.   Cardiovascular: Negative.   Gastrointestinal: Negative.   Genitourinary: Negative.   Musculoskeletal: Negative.   Skin: Negative.   Neurological: Positive for tingling, focal  weakness and weakness.       Dysesthesias on the left upper extremity. Some weakness and clumsiness of right upper extremity noted over the past several weeks.  Endo/Heme/Allergies: Negative.   Psychiatric/Behavioral: Negative.    Blood pressure 151/72, pulse 61, temperature 98.1 F (36.7 C), temperature source Oral, resp. rate 18, height 5\' 8"  (1.727 m), weight 82.283 kg (181 lb 6.4 oz), SpO2 99.00%. Physical Exam  Constitutional: He is oriented to person, place, and time. He appears well-nourished.  Eyes: Conjunctivae normal and EOM are normal. Pupils are equal, round, and reactive to light.  Neck: Normal range of motion. Neck supple.  Cardiovascular: Normal rate and regular rhythm.   Respiratory: Effort normal and breath sounds normal.  GI: Soft. Bowel sounds are normal.  Neurological: He is alert and oriented to person, place, and time.       Patient's speech appears fluent cranial nerves are within the limits of normal. There is a slight right cortical drift. The lower extremities reveal good strength to confrontation station and gait appears intact. Deep tendon reflexes are intact in the biceps and triceps bilaterally intact in the breakthrough the house. They're intact in the patellae and the Achilles both. Babinski reflexes are downgoing bilaterally.  Skin: Skin is warm and dry.    Assessment/Plan: Bilateral chronic subdural hematomas larger on the left side mild to moderate on the right side.  The patient's neurologic status appears quite intact save for a very modest right-sided cortical drift. I've advised the patient that we can observe these at the current time to see if there is further resolution however if he should have any worsening of the symptoms may likely require burr hole drainage of the larger of the subdural hematomas. I indicated that there is a concern that this may cause the smaller ones to enlarge however at this point not certain that any intervention needs to be  undertaken for his condition.  Kimra Kantor J 03/15/2012, 12:26 PM

## 2012-03-16 DIAGNOSIS — I2510 Atherosclerotic heart disease of native coronary artery without angina pectoris: Secondary | ICD-10-CM

## 2012-03-16 LAB — CBC
MCH: 32.5 pg (ref 26.0–34.0)
MCHC: 34.7 g/dL (ref 30.0–36.0)
Platelets: 247 10*3/uL (ref 150–400)
RBC: 4.64 MIL/uL (ref 4.22–5.81)

## 2012-03-16 LAB — BASIC METABOLIC PANEL
CO2: 22 mEq/L (ref 19–32)
Calcium: 10 mg/dL (ref 8.4–10.5)
GFR calc non Af Amer: 72 mL/min — ABNORMAL LOW (ref >90)
Sodium: 134 mEq/L — ABNORMAL LOW (ref 135–145)

## 2012-03-16 MED ORDER — LOSARTAN POTASSIUM 50 MG PO TABS
50.0000 mg | ORAL_TABLET | Freq: Every day | ORAL | Status: DC
  Administered 2012-03-16 – 2012-03-17 (×2): 50 mg via ORAL
  Filled 2012-03-16 (×2): qty 1

## 2012-03-16 NOTE — Progress Notes (Signed)
Patient had episode of emesis at dinnertime.  Stated nausea was relieved with emesis, and also headache.  BP 125/66.  Dr. Elisabeth Pigeon paged and notified.  No new orders received.  Neuro check no changed.  Will continue to monitor.  Colman Cater

## 2012-03-16 NOTE — Progress Notes (Signed)
Utilization review completed.  

## 2012-03-16 NOTE — Progress Notes (Signed)
Patient ID: Andre Warren, male   DOB: Jul 24, 1945, 67 y.o.   MRN: [id number] Patient's awake alert oriented denies any significant headaches or other complaints. He has been at bedrest.  Physical exam demonstrates his neck is supple he has no evidence of a cortical drift. Motor function in the upper and lower extremities appears intact. Reflexes are symmetric. Babinski's negative.  Though the patient has fairly sizable subdural hematoma collections he is clinically normal at this time have advised the patient we can observe these and I can follow him as an outpatient with repeat CT scan in approximately 2 weeks' time. The patient may be mobilized from a neurosurgical standpoint. L. contact Dr. Elisabeth Pigeon to discuss this with her.

## 2012-03-16 NOTE — Progress Notes (Signed)
Patient ID: Andre Warren, male   DOB: 07-Oct-1945, 67 y.o.   MRN: [id number]  [facility] PROGRESS NOTE  EDRIC FETTERMAN BMW:[id number] DOB: 01/14/1946 DOA: 03/14/2012 PCP: Nadean Corwin, MD  Brief narrative: Patient is a 67 year old individual who suffered a fall from a ladder approximately 8-10 feet in October and did strike the back of his head quite briskly though he did not have loss of consciousness. He denies any significant history of headache since that time but had intermittent episodes of dizziness. Several weeks prior to this admission he noticed left upper and lower extremity weakness associated with numbness and tingling.  In ED, CT scan demonstrated a presence of a moderate-sized chronic sub-dural hematoma on the right side and a large subdural hematoma on the left side there is minimal left to right shift of 3 mm.   Assessment/Plan:  Active Problems:  Paresthesias secondary to Subdural hematoma, chronic - pt is clinically improving  - neurosurgery following and we appreciate input - will continue to observe for now and follow up on neurosurgery recommendations   Malignant hypertension - BP improving  - continue current medical regimen  CAD (coronary artery disease) - appears clinically stable  Hyperlipidemia - continue statin   Code Status: Full Family Communication: Pt at bedside Disposition Plan: Home when medically stable  Manson Passey, MD  [facility] Pager [phone]  If 7PM-7AM, please contact night-coverage [email or web address] Password [email or web address] 03/16/2012, 10:53 AM   LOS: 2 days   Consultants:  Neurosurgery   Procedures:  None  Antibiotics:  None  HPI/Subjective: Pt reports feeling better, denies headaches or visual changes. No weakness in right or left upper and lower extremity.   Objective: Filed Vitals:   03/15/12 2208 03/16/12 0000 03/16/12 0400 03/16/12 0558  BP: 142/76 161/81 129/72 131/72  Pulse:  77 67   Temp:   97.8 F (36.6 C) 97.7 F (36.5 C)   TempSrc:  Oral Oral   Resp:  18 18   Height:      Weight:      SpO2:  98% 98%     Intake/Output Summary (Last 24 hours) at 03/16/12 1053 Last data filed at 03/16/12 0400  Gross per 24 hour  Intake    243 ml  Output   2050 ml  Net  -1807 ml    Exam:   General:  Pt is alert, follows commands appropriately, not in acute distress  Cardiovascular: Regular rate and rhythm, S1/S2, no murmurs, no rubs, no gallops  Respiratory: Clear to auscultation bilaterally, no wheezing, no crackles, no rhonchi  Abdomen: Soft, non tender, non distended, bowel sounds present, no guarding  Extremities: No edema, pulses DP and PT palpable bilaterally  Neuro: Grossly nonfocal  Data Reviewed: Basic Metabolic Panel:  Lab 03/16/12 4403 03/14/12 2230  NA 134* 136  K 4.5 3.7  CL 99 101  CO2 22 23  GLUCOSE 123* 120*  BUN 17 16  CREATININE 1.06 1.01  CALCIUM 10.0 9.9  MG -- --  PHOS -- --   Liver Function Tests:  Lab 03/14/12 2230  AST 17  ALT 11  ALKPHOS 90  BILITOT 0.7  PROT 7.3  ALBUMIN 4.0   CBC:  Lab 03/16/12 0603 03/14/12 2230  WBC 11.3* 7.8  NEUTROABS -- 4.3  HGB 15.1 14.4  HCT 43.5 42.1  MCV 93.8 93.6  PLT 247 215   Cardiac Enzymes:  Lab 03/14/12 2230  CKTOTAL --  CKMB --  CKMBINDEX --  TROPONINI <0.30  BNP: No components found with this basename: POCBNP:5 CBG:  Lab 03/14/12 2310  GLUCAP 94   Studies: Ct Head (brain) Wo Contrast 03/14/2012  Bilateral chronic subdural hematomas, much larger on the left. Minimal left to right shift.  Moderate compression of the underlying brain on the left side.  Scheduled Meds:   . atorvastatin  120 mg Oral Daily  . bisoprolol  5 mg Oral Daily   And  . hydrochlorothiazide  6.25 mg Oral Daily  . losartan  50 mg Oral Daily  . metoprolol  25 mg Oral BID  . sodium chloride  3 mL Intravenous Q12H   Continuous Infusions:

## 2012-03-17 LAB — BASIC METABOLIC PANEL
CO2: 28 mEq/L (ref 19–32)
Chloride: 98 mEq/L (ref 96–112)
Sodium: 135 mEq/L (ref 135–145)

## 2012-03-17 LAB — CBC
Platelets: 246 10*3/uL (ref 150–400)
RBC: 4.85 MIL/uL (ref 4.22–5.81)
WBC: 12.2 10*3/uL — ABNORMAL HIGH (ref 4.0–10.5)

## 2012-03-17 MED ORDER — METOPROLOL TARTRATE 50 MG PO TABS: 25.0000 mg | ORAL_TABLET | Freq: Two times a day (BID) | ORAL | Status: DC

## 2012-03-17 NOTE — Progress Notes (Signed)
Reviewed discharge instructions with patient and wife, they stated their understanding.  Instructed patient to followup with Neurosurgeon in 2 weeks for followup CT scan of the head per neuro progress notes.  Patient discharged via wheelchair by volunteers.  Colman Cater

## 2012-03-17 NOTE — Discharge Summary (Signed)
Physician Discharge Summary  Andre Warren:[id number] DOB: 03-Apr-1945 DOA: 03/14/2012  PCP: Nadean Corwin, MD  Admit date: 03/14/2012 Discharge date: 03/17/2012  Time spent: 20 minutes  Recommendations for Outpatient Follow-up:  1. Follow up with Neurosurgeon in 1-2 weeks for Rpt CT scan (include homehealth, outpatient follow-up instructions, specific recommendations for PCP to follow-up on, etc.)  Discharge Diagnoses:  Active Problems:  Paresthesias  Subdural hematoma, chronic  Malignant hypertension  CAD (coronary artery disease)  Hyperlipidemia   Discharge Condition: good  Diet recommendation: heart healthy  Filed Weights   03/14/12 2210 03/15/12 0430  Weight: 79.379 kg (175 lb) 82.283 kg (181 lb 6.4 oz)    History of present illness:  This 67 year old [facility] with left arm numbness.  This was in the setting of a fall in October. He states he had felt dizzy but never sought attention. The symptoms lasted for 4 hours-CT scan of the brain showed bilateral chronic subdural hematomas left greater than right. Blood pressures were in the 220/100 range. Patient was transferred over to [facility] under the direction of Dr. Danielle Dess NS to be evaluated  Hospital Course:  Paresthesias secondary to Subdural hematoma, chronic  - pt is clinically improving  - neurosurgery following -they recommended that the patient get a repeat CT scan after thoroughly discussing this benefits of burr hole surgery -Patient elected after resolution of symptoms complains of neurosurgery to be discharged home for close followup in the outpatient setting Malignant hypertension  - BP improving -patient was discharged home on metoprolol 25 twice a day, losartan 50 daily. CAD (coronary artery disease)  - appears clinically stable  Hyperlipidemia  -Asian discharged on Pravachol.   Procedures:  CT scan hea   Consultations:  Neurosurgery-Dr. Danielle Dess  Discharge  Exam: Filed Vitals:   03/17/12 0400 03/17/12 0800 03/17/12 1002 03/17/12 1200  BP: 130/73 130/78 135/77 148/72  Pulse: 64 60 59 49  Temp: 98.2 F (36.8 C) 98 F (36.7 C)  98 F (36.7 C)  TempSrc: Oral Oral  Oral  Resp: 18 18  18   Height:      Weight:      SpO2: 100% 98%  98%   Feels well, hasn't passes this this morning. No falls no weakness no shortness of breath and chest pain. Tolerated diet and ambulated without much assistance  General: A pleasant oriented moving all 4 limbs equally no focal neurological deficit noted Cardiovascular: S1-S2 no murmur or gallop telemetry = Non tele Respiratory: Clinically clear  Discharge Instructions  Discharge Orders    Future Orders Please Complete By Expires   Diet - low sodium heart healthy      Increase activity slowly      Call MD for:  temperature >100.4      Call MD for:  redness, tenderness, or signs of infection (pain, swelling, redness, odor or green/yellow discharge around incision site)      Call MD for:  difficulty breathing, headache or visual disturbances      Call MD for:  persistant dizziness or light-headedness      Call MD for:  persistant nausea and vomiting          Medication List     As of 03/17/2012  3:02 PM    STOP taking these medications         aspirin 325 MG tablet      bisoprolol-hydrochlorothiazide 5-6.25 MG per tablet   Commonly known as: [employer]  TAKE these medications         atorvastatin 80 MG tablet   Commonly known as: LIPITOR   Take 40 mg by mouth daily.      losartan 50 MG tablet   Commonly known as: COZAAR   Take 25 mg by mouth daily. One half tablet daily      metoprolol 50 MG tablet   Commonly known as: LOPRESSOR   Take 0.5 tablets (25 mg total) by mouth 2 (two) times daily. Take one half tablet           Follow-up Information    Follow up with Stefani Dama, MD. In 10 weeks. (schedule CT with Dr. Danielle Dess)    Contact information:   [address] [place] [address] [phone]       Follow up with Nadean Corwin, MD.   Contact information:   [address] [place] [address] [phone]           The results of significant diagnostics from this hospitalization (including imaging, microbiology, ancillary and laboratory) are listed below for reference.    Significant Diagnostic Studies: Ct Head (brain) Wo Contrast  03/14/2012  *RADIOLOGY REPORT*  Clinical Data: Pain in calf. Numbness.  CT HEAD WITHOUT CONTRAST  Technique:  Contiguous axial images were obtained from the base of the skull through the vertex without contrast.  Comparison: None.  Findings: Bilateral hypodense extra-axial fluid collections are seen, left much greater than right measuring 21 mm thick over the left convexity and 13 mm thick over the right convexity. The left subdural collection extends over a much larger area from anterior- posterior, whereas the right subdural collection is more focal localized to the right parietal lobe.  Although contrast was not administered, there appears to be a medial surrounding hyperdense structure, likely representing a chronic subdural membrane.  There is minimal subfalcine shift at the level of the corpus callosum approximately 3 mm.  No acute stroke.  No hydrocephalus.  Probable mild atrophy is obscured by the intracranial space-occupying lesions.  No significant white matter disease.  No skull fracture is present. Sinuses and mastoids are clear.  IMPRESSION: Bilateral chronic subdural hematomas, much larger on the left. Minimal left to right shift.  Moderate compression of the underlying brain on the left side.  Critical Value/emergent results were called by telephone at the time of interpretation on 03/14/12 at 10:59 p.m. to Dr. Ethelda Chick, who verbally acknowledged these results.   Original Report Authenticated By: Davonna Belling, M.D.     Microbiology: No results found for this or any previous visit  (from the past 240 hour(s)).   Labs: Basic Metabolic Panel:  Lab 03/17/12 1324 03/16/12 0603 03/14/12 2230  NA 135 134* 136  K 5.0 4.5 3.7  CL 98 99 101  CO2 28 22 23   GLUCOSE 104* 123* 120*  BUN 22 17 16   CREATININE 1.17 1.06 1.01  CALCIUM 10.6* 10.0 9.9  MG -- -- --  PHOS -- -- --   Liver Function Tests:  Lab 03/14/12 2230  AST 17  ALT 11  ALKPHOS 90  BILITOT 0.7  PROT 7.3  ALBUMIN 4.0   No results found for this basename: LIPASE:5,AMYLASE:5 in the last 168 hours No results found for this basename: AMMONIA:5 in the last 168 hours CBC:  Lab 03/17/12 0610 03/16/12 0603 03/14/12 2230  WBC 12.2* 11.3* 7.8  NEUTROABS -- -- 4.3  HGB 15.7 15.1 14.4  HCT 45.9 43.5 42.1  MCV 94.6  93.8 93.6  PLT 246 247 215   Cardiac Enzymes:  Lab 03/14/12 2230  CKTOTAL --  CKMB --  CKMBINDEX --  TROPONINI <0.30   BNP: BNP (last 3 results) No results found for this basename: PROBNP:3 in the last 8760 hours CBG:  Lab 03/14/12 2310  GLUCAP 94       Signed:  Myia Bergh, JAI-GURMUKH  [facility] 03/17/2012, 3:02 PM

## 2012-04-02 ENCOUNTER — Ambulatory Visit (HOSPITAL_COMMUNITY)
Admission: RE | Admit: 2012-04-02 | Discharge: 2012-04-02 | Disposition: A | Discharge status: Home / Self Care | Payer: Medicare Other | Source: Ambulatory Visit | Attending: Neurological Surgery | Admitting: Neurological Surgery

## 2012-04-02 ENCOUNTER — Other Ambulatory Visit (HOSPITAL_COMMUNITY): Payer: Self-pay | Admitting: Neurological Surgery

## 2012-04-02 DIAGNOSIS — I6200 Nontraumatic subdural hemorrhage, unspecified: Secondary | ICD-10-CM | POA: Insufficient documentation

## 2012-04-15 ENCOUNTER — Other Ambulatory Visit (HOSPITAL_COMMUNITY): Payer: Self-pay | Admitting: Neurological Surgery

## 2012-04-15 DIAGNOSIS — I6200 Nontraumatic subdural hemorrhage, unspecified: Secondary | ICD-10-CM

## 2012-05-03 ENCOUNTER — Ambulatory Visit (HOSPITAL_COMMUNITY)
Admission: RE | Admit: 2012-05-03 | Discharge: 2012-05-03 | Disposition: A | Discharge status: Home / Self Care | Payer: Medicare Other | Source: Ambulatory Visit | Attending: Neurological Surgery | Admitting: Neurological Surgery

## 2012-05-03 DIAGNOSIS — Z09 Encounter for follow-up examination after completed treatment for conditions other than malignant neoplasm: Secondary | ICD-10-CM | POA: Insufficient documentation

## 2012-05-03 DIAGNOSIS — I6200 Nontraumatic subdural hemorrhage, unspecified: Secondary | ICD-10-CM

## 2012-06-09 ENCOUNTER — Other Ambulatory Visit (HOSPITAL_COMMUNITY): Payer: Self-pay | Admitting: Neurological Surgery

## 2012-06-09 DIAGNOSIS — I6200 Nontraumatic subdural hemorrhage, unspecified: Secondary | ICD-10-CM

## 2012-06-21 ENCOUNTER — Ambulatory Visit (HOSPITAL_COMMUNITY)
Admission: RE | Admit: 2012-06-21 | Discharge: 2012-06-21 | Disposition: A | Discharge status: Home / Self Care | Payer: Medicare Other | Source: Ambulatory Visit | Attending: Neurological Surgery | Admitting: Neurological Surgery

## 2012-06-21 DIAGNOSIS — G319 Degenerative disease of nervous system, unspecified: Secondary | ICD-10-CM | POA: Insufficient documentation

## 2012-06-21 DIAGNOSIS — I6200 Nontraumatic subdural hemorrhage, unspecified: Secondary | ICD-10-CM

## 2012-10-13 ENCOUNTER — Other Ambulatory Visit: Payer: Self-pay

## 2013-01-13 ENCOUNTER — Other Ambulatory Visit: Payer: Self-pay

## 2013-02-07 ENCOUNTER — Ambulatory Visit: Payer: Self-pay | Admitting: Physician Assistant

## 2013-02-20 ENCOUNTER — Encounter: Payer: Self-pay | Admitting: Internal Medicine

## 2013-02-20 DIAGNOSIS — E7800 Pure hypercholesterolemia, unspecified: Secondary | ICD-10-CM | POA: Insufficient documentation

## 2013-02-20 DIAGNOSIS — I10 Essential (primary) hypertension: Secondary | ICD-10-CM | POA: Insufficient documentation

## 2013-02-22 ENCOUNTER — Encounter: Payer: Self-pay | Admitting: Physician Assistant

## 2013-02-22 ENCOUNTER — Ambulatory Visit (INDEPENDENT_AMBULATORY_CARE_PROVIDER_SITE_OTHER): Payer: Medicare Other | Admitting: Physician Assistant

## 2013-02-22 VITALS — BP 102/62 | HR 60 | Temp 97.7°F | Resp 16 | Ht 68.0 in | Wt 176.0 lb

## 2013-02-22 DIAGNOSIS — I10 Essential (primary) hypertension: Secondary | ICD-10-CM

## 2013-02-22 DIAGNOSIS — E785 Hyperlipidemia, unspecified: Secondary | ICD-10-CM

## 2013-02-22 DIAGNOSIS — E782 Mixed hyperlipidemia: Secondary | ICD-10-CM

## 2013-02-22 LAB — CBC WITH DIFFERENTIAL/PLATELET
Basophils Absolute: 0 10*3/uL (ref 0.0–0.1)
Eosinophils Absolute: 0.3 10*3/uL (ref 0.0–0.7)
Eosinophils Relative: 4 % (ref 0–5)
MCH: 32.3 pg (ref 26.0–34.0)
MCV: 94.7 fL (ref 78.0–100.0)
Platelets: 230 10*3/uL (ref 150–400)
RDW: 13.7 % (ref 11.5–15.5)
WBC: 7.7 10*3/uL (ref 4.0–10.5)

## 2013-02-22 NOTE — Progress Notes (Signed)
HPI Patient presents for 3 month follow up with hypertension, hyperlipidemia, prediabetes and vitamin D. Patient's blood pressure has been controlled at home, today their BP today is  BP: 102/62 mmHg  Patient denies shortness of breath, dizziness. He does have chest discomfort, had a normal cardiolite at the [facility] hospital June 18 2012 but continues to have chest discomfort. It has gotten better since started on Imdur by [employer]. States the discomfort can be exertional or nonexertional, center chest pressure can last 1-2 mins to 5-6 mins, denies radiation and accompaniments- only one time that patient had SOB with mowing the lawn and the chest discomfort.  Patient's cholesterol is diet controlled. In addition they are on Lipitor  and denies myalgias. The cholesterol last visit was LDL was 73. Patient is on Vitamin D supplement.   Current Medications:  Current Outpatient Prescriptions on File Prior to Visit  Medication Sig Dispense Refill  . atorvastatin (LIPITOR) 80 MG tablet Take 40 mg by mouth daily.      Marland Kitchen BABY ASPIRIN PO Take 81 mg by mouth daily.      . bisoprolol-hydrochlorothiazide (ZIAC) 5-6.25 MG per tablet Take 1 tablet by mouth daily.      . Cholecalciferol (VITAMIN D PO) Take 8,000 Int'l Units by mouth daily.      Marland Kitchen losartan (COZAAR) 50 MG tablet Take 25 mg by mouth daily. One half tablet daily      . metoprolol (LOPRESSOR) 50 MG tablet Take 0.5 tablets (25 mg total) by mouth 2 (two) times daily. Take one half tablet  60 tablet  0   No current facility-administered medications on file prior to visit.   Medical History:  Past Medical History  Diagnosis Date  . CAD (coronary artery disease)   . Hypercholesteremia   . Hypertension    Allergies:  Allergies  Allergen Reactions  . Latex Itching    ROS Constitutional: Denies fever, chills, headaches, insomnia, fatigue, night sweats Eyes: Denies redness, blurred vision, diplopia, discharge, itchy, watery eyes.  ENT: Denies  congestion, post nasal drip, sore throat, earache, dental pain, Tinnitus, Vertigo, Sinus pain, snoring.  Cardio: + CP Denies  palpitations, irregular heartbeat, dyspnea, diaphoresis, orthopnea, PND, claudication, edema Respiratory: denies cough, shortness of breath, wheezing.  Gastrointestinal: Denies dysphagia, heartburn, AB pain/ cramps, N/V, diarrhea, constipation, hematemesis, melena, hematochezia,  hemorrhoids Genitourinary: Denies dysuria, frequency, urgency, nocturia, hesitancy, discharge, hematuria, flank pain Musculoskeletal: Denies myalgia, stiffness, pain, swelling and strain/sprain. Skin: Denies pruritis, rash, changing in skin lesion Neuro: Denies Weakness, tremor, incoordination, spasms, pain Psychiatric: Denies confusion, memory loss, sensory loss Endocrine: Denies change in weight, skin, hair change, nocturia Diabetic Polys, Denies visual blurring, hyper /hypo glycemic episodes, and paresthesia, Heme/Lymph: Denies Excessive bleeding, bruising, enlarged lymph nodes  Family history- Review and unchanged Social history- Review and unchanged Physical Exam: Filed Vitals:   02/22/13 0914  BP: 102/62  Pulse: 60  Temp: 97.7 F (36.5 C)  Resp: 16   Filed Weights   02/22/13 0914  Weight: 176 lb (79.833 kg)   General Appearance: Well nourished, in no apparent distress. Eyes: PERRLA, EOMs, conjunctiva no swelling or erythema Sinuses: No Frontal/maxillary tenderness ENT/Mouth: Ext aud canals clear, TMs without erythema, bulging. No erythema, swelling, or exudate on post pharynx.  Tonsils not swollen or erythematous. Hearing normal.  Neck: Supple, thyroid normal.  Respiratory: Respiratory effort normal, BS equal bilaterally without rales, rhonchi, wheezing or stridor.  Cardio: RRR with no MRGs. Brisk peripheral pulses without edema.  Abdomen: Soft, + BS.  Non tender, no guarding, rebound, hernias, masses. Lymphatics: Non tender without lymphadenopathy.  Musculoskeletal: Full  ROM, 5/5 strength, normal gait.  Skin: Warm, dry without rashes, lesions, ecchymosis.  Neuro: Cranial nerves intact. Normal muscle tone, no cerebellar symptoms. Sensation intact.  Psych: Awake and oriented X 3, normal affect, Insight and Judgment appropriate.   Assessment and Plan:  Hypertension: Continue medication, monitor blood pressure at home.  Continue DASH diet. Cholesterol: Continue diet and exercise. Check cholesterol.  Vitamin D Def- check level and continue medications.  Atypical chest pain however high risk with history of CABG- continue imdur and try dexilant samples and [facility], may need cath if continues.  Continue diet and meds as discussed. Further disposition pending results of labs.  Quentin Mulling 9:20 AM

## 2013-02-22 NOTE — Patient Instructions (Signed)
Fat and Cholesterol Control Diet  Fat and cholesterol levels in your blood and organs are influenced by your diet. High levels of fat and cholesterol may lead to diseases of the heart, small and large blood vessels, gallbladder, liver, and pancreas.  CONTROLLING FAT AND CHOLESTEROL WITH DIET  Although exercise and lifestyle factors are important, your diet is key. That is because certain foods are known to raise cholesterol and others to lower it. The goal is to balance foods for their effect on cholesterol and more importantly, to replace saturated and trans fat with other types of fat, such as monounsaturated fat, polyunsaturated fat, and omega-3 fatty acids.  On average, a person should consume no more than 15 to 17 g of saturated fat daily. Saturated and trans fats are considered "bad" fats, and they will raise LDL cholesterol. Saturated fats are primarily found in animal products such as meats, butter, and cream. However, that does not mean you need to give up all your favorite foods. Today, there are good tasting, low-fat, low-cholesterol substitutes for most of the things you like to eat. Choose low-fat or nonfat alternatives. Choose round or loin cuts of red meat. These types of cuts are lowest in fat and cholesterol. Chicken (without the skin), fish, veal, and ground turkey breast are great choices. Eliminate fatty meats, such as hot dogs and salami. Even shellfish have little or no saturated fat. Have a 3 oz (85 g) portion when you eat lean meat, poultry, or fish.  Trans fats are also called "partially hydrogenated oils." They are oils that have been scientifically manipulated so that they are solid at room temperature resulting in a longer shelf life and improved taste and texture of foods in which they are added. Trans fats are found in stick margarine, some tub margarines, cookies, crackers, and baked goods.   When baking and cooking, oils are a great substitute for butter. The monounsaturated oils are  especially beneficial since it is believed they lower LDL and raise HDL. The oils you should avoid entirely are saturated tropical oils, such as coconut and palm.   Remember to eat a lot from food groups that are naturally free of saturated and trans fat, including fish, fruit, vegetables, beans, grains (barley, rice, couscous, bulgur wheat), and pasta (without cream sauces).   IDENTIFYING FOODS THAT LOWER FAT AND CHOLESTEROL   Soluble fiber may lower your cholesterol. This type of fiber is found in fruits such as apples, vegetables such as broccoli, potatoes, and carrots, legumes such as beans, peas, and lentils, and grains such as barley. Foods fortified with plant sterols (phytosterol) may also lower cholesterol. You should eat at least 2 g per day of these foods for a cholesterol lowering effect.   Read package labels to identify low-saturated fats, trans fat free, and low-fat foods at the supermarket. Select cheeses that have only 2 to 3 g saturated fat per ounce. Use a heart-healthy tub margarine that is free of trans fats or partially hydrogenated oil. When buying baked goods (cookies, crackers), avoid partially hydrogenated oils. Breads and muffins should be made from whole grains (whole-wheat or whole oat flour, instead of "flour" or "enriched flour"). Buy non-creamy canned soups with reduced salt and no added fats.   FOOD PREPARATION TECHNIQUES   Never deep-fry. If you must fry, either stir-fry, which uses very little fat, or use non-stick cooking sprays. When possible, broil, bake, or roast meats, and steam vegetables. Instead of putting butter or margarine on vegetables, use lemon   and herbs, applesauce, and cinnamon (for squash and sweet potatoes). Use nonfat yogurt, salsa, and low-fat dressings for salads.   LOW-SATURATED FAT / LOW-FAT FOOD SUBSTITUTES  Meats / Saturated Fat (g)  · Avoid: Steak, marbled (3 oz/85 g) / 11 g  · Choose: Steak, lean (3 oz/85 g) / 4 g  · Avoid: Hamburger (3 oz/85 g) / 7  g  · Choose: Hamburger, lean (3 oz/85 g) / 5 g  · Avoid: Ham (3 oz/85 g) / 6 g  · Choose: Ham, lean cut (3 oz/85 g) / 2.4 g  · Avoid: Chicken, with skin, dark meat (3 oz/85 g) / 4 g  · Choose: Chicken, skin removed, dark meat (3 oz/85 g) / 2 g  · Avoid: Chicken, with skin, light meat (3 oz/85 g) / 2.5 g  · Choose: Chicken, skin removed, light meat (3 oz/85 g) / 1 g  Dairy / Saturated Fat (g)  · Avoid: Whole milk (1 cup) / 5 g  · Choose: Low-fat milk, 2% (1 cup) / 3 g  · Choose: Low-fat milk, 1% (1 cup) / 1.5 g  · Choose: Skim milk (1 cup) / 0.3 g  · Avoid: Hard cheese (1 oz/28 g) / 6 g  · Choose: Skim milk cheese (1 oz/28 g) / 2 to 3 g  · Avoid: Cottage cheese, 4% fat (1 cup) / 6.5 g  · Choose: Low-fat cottage cheese, 1% fat (1 cup) / 1.5 g  · Avoid: Ice cream (1 cup) / 9 g  · Choose: Sherbet (1 cup) / 2.5 g  · Choose: Nonfat frozen yogurt (1 cup) / 0.3 g  · Choose: Frozen fruit bar / trace  · Avoid: Whipped cream (1 tbs) / 3.5 g  · Choose: Nondairy whipped topping (1 tbs) / 1 g  Condiments / Saturated Fat (g)  · Avoid: Mayonnaise (1 tbs) / 2 g  · Choose: Low-fat mayonnaise (1 tbs) / 1 g  · Avoid: Butter (1 tbs) / 7 g  · Choose: Extra light margarine (1 tbs) / 1 g  · Avoid: Coconut oil (1 tbs) / 11.8 g  · Choose: Olive oil (1 tbs) / 1.8 g  · Choose: Corn oil (1 tbs) / 1.7 g  · Choose: Safflower oil (1 tbs) / 1.2 g  · Choose: Sunflower oil (1 tbs) / 1.4 g  · Choose: Soybean oil (1 tbs) / 2.4 g  · Choose: Canola oil (1 tbs) / 1 g  Document Released: 02/24/2005 Document Revised: 06/21/2012 Document Reviewed: 08/15/2010  [employer]® Patient Information ©2014 [employer].

## 2013-02-23 LAB — LIPID PANEL
Cholesterol: 156 mg/dL (ref 0–200)
HDL: 48 mg/dL (ref >39)
Total CHOL/HDL Ratio: 3.3 Ratio

## 2013-02-23 LAB — BASIC METABOLIC PANEL WITH GFR
Calcium: 10.7 mg/dL — ABNORMAL HIGH (ref 8.4–10.5)
Creat: 1.35 mg/dL (ref 0.50–1.35)
GFR, Est African American: 63 mL/min
GFR, Est Non African American: 54 mL/min — ABNORMAL LOW

## 2013-02-23 LAB — HEPATIC FUNCTION PANEL
Albumin: 4.8 g/dL (ref 3.5–5.2)
Total Bilirubin: 1 mg/dL (ref 0.3–1.2)

## 2013-05-08 ENCOUNTER — Encounter: Payer: Self-pay | Admitting: Internal Medicine

## 2013-05-08 DIAGNOSIS — Z79899 Other long term (current) drug therapy: Secondary | ICD-10-CM | POA: Insufficient documentation

## 2013-05-08 DIAGNOSIS — E559 Vitamin D deficiency, unspecified: Secondary | ICD-10-CM | POA: Insufficient documentation

## 2013-05-08 DIAGNOSIS — R7303 Prediabetes: Secondary | ICD-10-CM | POA: Insufficient documentation

## 2013-05-08 NOTE — Patient Instructions (Addendum)
error 

## 2013-05-08 NOTE — Progress Notes (Deleted)
Error

## 2013-05-09 ENCOUNTER — Encounter: Payer: Self-pay | Admitting: Internal Medicine

## 2013-05-10 NOTE — Progress Notes (Signed)
Patient ID: Andre Warren, male   DOB: 02-Jan-1946, 68 y.o.   MRN: [id number] Error

## 2013-11-12 NOTE — Progress Notes (Signed)
Patient ID: Andre Warren, male   DOB: 08-Jul-1945, 68 y.o.   MRN: [id number]   Annual Screening Comprehensive Examination  This very nice 68 y.o.MWM presents for complete physical.  Patient has been followed for HTN, ASCAD s/p CABG,   Prediabetes, Hyperlipidemia, and Vitamin D Deficiency.   HTN predates since 1998. Patient's BP has been controlled at home.Today's BP: 124/76 mmHg. In 2000 , patient had PTCA & Stent and tghen in 2004 underwent a CABG followed by a Right CE. Left carotid dopplers were recently rechecked at the [facility]. Patient denies any cardiac symptoms as chest pain, palpitations, shortness of breath, dizziness or ankle swelling.   Patient's hyperlipidemia is controlled to goal with diet and medications. Patient denies myalgias or other medication SE's. Last lipids were Total Chol 156; HDL  48; LDL  80; Trig 140 on 02/22/2013.   Patient is screened for prediabetes and patient denies reactive hypoglycemic symptoms, visual blurring, diabetic polys or paresthesias. Last A1c was 5.6% in Aug 2014.     Finally, patient has history of Vitamin D Deficiency of 36 in 2008 and last vitamin D was 56 in Aug 2014.   Medication Sig  . BABY ASPIRIN PO Take 81 mg  daily.  . bisoprolol-hctz 5-6.25  Take 1 tablet  daily.  Marland Kitchen VITAMIN D  Take 8,000 u.daily.  .  IMDUR) 30 MG 24 hr tab Take 1 tab daily  . losartan (COZAAR) 50 MG tab Take 1/2 tab QD  . metoprolol tartrate 50 MG tab Take 1/2 tab BID   Allergies  Allergen Reactions  . Latex Itching   Past Medical History  Diagnosis Date  . CAD (coronary artery disease)   . Hypercholesteremia   . Hypertension   . Subdural hematoma, post-traumatic 03/2012    Saw Dr. Danielle Dess has been released.   . Peripheral arterial disease     LCEA   Past Surgical History  Procedure Laterality Date  . Coronary angioplasty with stent placement      Became occluded and resulted in CABG  . Carotid endarterectomy  2004  . Coronary artery bypass graft      x 3    Family History  Problem Relation Age of Onset  . Hypertension Mother   . Heart disease Mother   . Hypertension Father   . Stroke Father   . Heart disease Father   . Cancer Father   . Cancer Sister   . Heart disease Sister    History   Social History  . Marital Status: Married    Spouse Name: N/A    Number of Children: N/A  . Years of Education: N/A   Occupational History  . Not on file.   Social History Main Topics  . Smoking status: Never Smoker   . Smokeless tobacco: Not on file  . Alcohol Use: No  . Drug Use: No  . Sexual Activity: Yes    Birth Control/ Protection: None    ROS Constitutional: Denies fever, chills, weight loss/gain, headaches, insomnia, fatigue, night sweats or change in appetite. Eyes: Denies redness, blurred vision, diplopia, discharge, itchy or watery eyes.  ENT: Denies discharge, congestion, post nasal drip, epistaxis, sore throat, earache, hearing loss, dental pain, Tinnitus, Vertigo, Sinus pain or snoring.  Cardio: Denies chest pain, palpitations, irregular heartbeat, syncope, dyspnea, diaphoresis, orthopnea, PND, claudication or edema Respiratory: denies cough, dyspnea, DOE, pleurisy, hoarseness, laryngitis or wheezing.  Gastrointestinal: Denies dysphagia, heartburn, reflux, water brash, pain, cramps, nausea, vomiting, bloating, diarrhea, constipation, hematemesis, melena,  hematochezia, jaundice or hemorrhoids Genitourinary: Denies dysuria, frequency, urgency, nocturia, hesitancy, discharge, hematuria or flank pain Musculoskeletal: Denies arthralgia, myalgia, stiffness, Jt. Swelling, pain, limp or strain/sprain. Denies Falls. Skin: Denies puritis, rash, hives, warts, acne, eczema or change in skin lesion Neuro: No weakness, tremor, incoordination, spasms, paresthesia or pain Psychiatric: Denies confusion, memory loss or sensory loss. Denies Depression. Endocrine: Denies change in weight, skin, hair change, nocturia, and paresthesia, diabetic  polys, visual blurring or hyper / hypo glycemic episodes.  Heme/Lymph: No excessive bleeding, bruising or enlarged lymph nodes.   Physical Exam  BP 124/76  Pulse 64  Temp(Src) 97.3 F (36.3 C) (Temporal)  Resp 16  Ht  (1.727 m)  Wt 174 lb (78.926 kg)  BMI 26.46 kg/m2  General Appearance: Well nourished, in no apparent distress. Eyes: PERRLA, EOMs, conjunctiva no swelling or erythema, normal fundi and vessels. Sinuses: No frontal/maxillary tenderness ENT/Mouth: EACs patent / TMs  nl. Nares clear without erythema, swelling, mucoid exudates. Oral hygiene is good. No erythema, swelling, or exudate. Tongue normal, non-obstructing. Tonsils not swollen or erythematous. Hearing normal.  Neck: Supple, thyroid normal. No bruits, nodes or JVD. Respiratory: Respiratory effort normal.  BS equal and clear bilateral without rales, rhonci, wheezing or stridor. Cardio: Heart sounds are normal with regular rate and rhythm and no murmurs, rubs or gallops. Peripheral pulses are normal and equal bilaterally without edema. No aortic or femoral bruits. Chest: symmetric with normal excursions and percussion.  Abdomen: Flat, soft, with bowl sounds. Nontender, no guarding, rebound, hernias, masses, or organomegaly.  Lymphatics: Non tender without lymphadenopathy.  Genitourinary: No hernias.Testes nl. DRE - prostate nl for age - smooth & firm w/o nodules. Musculoskeletal: Full ROM all peripheral extremities, joint stability, 5/5 strength, and normal gait. Skin: Warm and dry without rashes, lesions, cyanosis, clubbing or  ecchymosis.  Neuro: Cranial nerves intact, reflexes equal bilaterally. Normal muscle tone, no cerebellar symptoms. Sensation intact.  Pysch: Awake and oriented X 3with normal affect, insight and judgment appropriate.  Assessment and Plan  1. Annual Screening Examination 2. Hypertension  3. ASCAD / CABG 4. Hyperlipidemia 5. Pre Diabetes 6. Vitamin D Deficiency   Continue prudent  diet as discussed, weight control, BP monitoring, regular exercise, and medications as discussed.  Discussed med effects and SE's. Routine screening labs and tests as requested with regular follow-up as recommended.

## 2013-11-12 NOTE — Patient Instructions (Addendum)
Recommend the book "The END of DIETING" by Dr Baker Janus   and the book "The END of DIABETES " by Dr Excell Seltzer  At [email or web address] - get book & Audio CD's      Being diabetic has a  300% increased risk for heart attack, stroke, cancer, and alzheimer- type vascular dementia. It is very important that you work harder with diet by avoiding all foods that are white except chicken & fish. Avoid white rice (brown & wild rice is OK), white potatoes (sweetpotatoes in moderation is OK), White bread or wheat bread or anything made out of white flour like bagels, donuts, rolls, buns, biscuits, cakes, pastries, cookies, pizza crust, and pasta (made from white flour & egg whites) - vegetarian pasta or spinach or wheat pasta is OK. Multigrain breads like Arnold's or [employer], or multigrain sandwich thins or flatbreads.  Diet, exercise and weight loss can reverse and cure diabetes in the early stages.  Diet, exercise and weight loss is very important in the control and prevention of complications of diabetes which affects every system in your body, ie. Brain - dementia/stroke, eyes - glaucoma/blindness, heart - heart attack/heart failure, kidneys - dialysis, stomach - gastric paralysis, intestines - malabsorption, nerves - severe painful neuritis, circulation - gangrene & loss of a leg(s), and finally cancer and Alzheimers.    I recommend avoid fried & greasy foods,  sweets/candy, white rice (brown or wild rice or Quinoa is OK), white potatoes (sweet potatoes are OK) - anything made from white flour - bagels, doughnuts, rolls, buns, biscuits,white and wheat breads, pizza crust and traditional pasta made of white flour & egg white(vegetarian pasta or spinach or wheat pasta is OK).  Multi-grain bread is OK - like multi-grain flat bread or sandwich thins. Avoid alcohol in excess. Exercise is also important.    Eat all the vegetables you want - avoid meat, especially red meat and dairy - especially cheese.  Cheese  is the most concentrated form of trans-fats which is the worst thing to clog up our arteries. Veggie cheese is OK which can be found in the fresh produce section at [employer] or [employer] or [employer]  Preventive Care for Adults A healthy lifestyle and preventive care can promote health and wellness. Preventive health guidelines for men include the following key practices:  A routine yearly physical is a good way to check with your health care provider about your health and preventative screening. It is a chance to share any concerns and updates on your health and to receive a thorough exam.  Visit your dentist for a routine exam and preventative care every 6 months. Brush your teeth twice a day and floss once a day. Good oral hygiene prevents tooth decay and gum disease.  The frequency of eye exams is based on your age, health, family medical history, use of contact lenses, and other factors. Follow your health care provider's recommendations for frequency of eye exams.  Eat a healthy diet. Foods such as vegetables, fruits, whole grains, low-fat dairy products, and lean protein foods contain the nutrients you need without too many calories. Decrease your intake of foods high in solid fats, added sugars, and salt. Eat the right amount of calories for you.Get information about a proper diet from your health care provider, if necessary.  Regular physical exercise is one of the most important things you can do for your health. Most adults should get at least 150 minutes of moderate-intensity exercise (any activity that  increases your heart rate and causes you to sweat) each week. In addition, most adults need muscle-strengthening exercises on 2 or more days a week.  Maintain a healthy weight. The body mass index (BMI) is a screening tool to identify possible weight problems. It provides an estimate of body fat based on height and weight. Your health care provider can find your BMI and can help you  achieve or maintain a healthy weight.For adults 20 years and older:  A BMI below 18.5 is considered underweight.  A BMI of 18.5 to 24.9 is normal.  A BMI of 25 to 29.9 is considered overweight.  A BMI of 30 and above is considered obese.  Maintain normal blood lipids and cholesterol levels by exercising and minimizing your intake of saturated fat. Eat a balanced diet with plenty of fruit and vegetables. Blood tests for lipids and cholesterol should begin at age 20 and be repeated every 5 years. If your lipid or cholesterol levels are high, you are over 50, or you are at high risk for heart disease, you may need your cholesterol levels checked more frequently.Ongoing high lipid and cholesterol levels should be treated with medicines if diet and exercise are not working.  If you smoke, find out from your health care provider how to quit. If you do not use tobacco, do not start.  Lung cancer screening is recommended for adults aged 72-80 years who are at high risk for developing lung cancer because of a history of smoking. A yearly low-dose CT scan of the lungs is recommended for people who have at least a 30-pack-year history of smoking and are a current smoker or have quit within the past 15 years. A pack year of smoking is smoking an average of 1 pack of cigarettes a day for 1 year (for example: 1 pack a day for 30 years or 2 packs a day for 15 years). Yearly screening should continue until the smoker has stopped smoking for at least 15 years. Yearly screening should be stopped for people who develop a health problem that would prevent them from having lung cancer treatment.  If you choose to drink alcohol, do not have more than 2 drinks per day. One drink is considered to be 12 ounces (355 mL) of beer, 5 ounces (148 mL) of wine, or 1.5 ounces (44 mL) of liquor.  Avoid use of street drugs. Do not share needles with anyone. Ask for help if you need support or instructions about stopping the use of  drugs.  High blood pressure causes heart disease and increases the risk of stroke. Your blood pressure should be checked at least every 1-2 years. Ongoing high blood pressure should be treated with medicines, if weight loss and exercise are not effective.  If you are 28-64 years old, ask your health care provider if you should take aspirin to prevent heart disease.  Diabetes screening involves taking a blood sample to check your fasting blood sugar level. This should be done once every 3 years, after age 13, if you are within normal weight and without risk factors for diabetes. Testing should be considered at a younger age or be carried out more frequently if you are overweight and have at least 1 risk factor for diabetes.  Colorectal cancer can be detected and often prevented. Most routine colorectal cancer screening begins at the age of 78 and continues through age 56. However, your health care provider may recommend screening at an earlier age if you have risk  factors for colon cancer. On a yearly basis, your health care provider may provide home test kits to check for hidden blood in the stool. Use of a small camera at the end of a tube to directly examine the colon (sigmoidoscopy or colonoscopy) can detect the earliest forms of colorectal cancer. Talk to your health care provider about this at age 48, when routine screening begins. Direct exam of the colon should be repeated every 5-10 years through age 60, unless early forms of precancerous polyps or small growths are found.  People who are at an increased risk for hepatitis B should be screened for this virus. You are considered at high risk for hepatitis B if:  You were born in a country where hepatitis B occurs often. Talk with your health care provider about which countries are considered high risk.  Your parents were born in a high-risk country and you have not received a shot to protect against hepatitis B (hepatitis B vaccine).  You have  HIV or AIDS.  You use needles to inject street drugs.  You live with, or have sex with, someone who has hepatitis B.  You are a man who has sex with other men (MSM).  You get hemodialysis treatment.  You take certain medicines for conditions such as cancer, organ transplantation, and autoimmune conditions.  Hepatitis C blood testing is recommended for all people born from [birth year] through 1965 and any individual with known risks for hepatitis C.  Practice safe sex. Use condoms and avoid high-risk sexual practices to reduce the spread of sexually transmitted infections (STIs). STIs include gonorrhea, chlamydia, syphilis, trichomonas, herpes, HPV, and human immunodeficiency virus (HIV). Herpes, HIV, and HPV are viral illnesses that have no cure. They can result in disability, cancer, and death.  If you are at risk of being infected with HIV, it is recommended that you take a prescription medicine daily to prevent HIV infection. This is called preexposure prophylaxis (PrEP). You are considered at risk if:  You are a man who has sex with other men (MSM) and have other risk factors.  You are a heterosexual man, are sexually active, and are at increased risk for HIV infection.  You take drugs by injection.  You are sexually active with a partner who has HIV.  Talk with your health care provider about whether you are at high risk of being infected with HIV. If you choose to begin PrEP, you should first be tested for HIV. You should then be tested every 3 months for as long as you are taking PrEP.  A one-time screening for abdominal aortic aneurysm (AAA) and surgical repair of large AAAs by ultrasound are recommended for men ages 51 to 11 years who are current or former smokers.  Healthy men should no longer receive prostate-specific antigen (PSA) blood tests as part of routine cancer screening. Talk with your health care provider about prostate cancer screening.  Testicular cancer screening is  not recommended for adult males who have no symptoms. Screening includes self-exam, a health care provider exam, and other screening tests. Consult with your health care provider about any symptoms you have or any concerns you have about testicular cancer.  Use sunscreen. Apply sunscreen liberally and repeatedly throughout the day. You should seek shade when your shadow is shorter than you. Protect yourself by wearing long sleeves, pants, a wide-brimmed hat, and sunglasses year round, whenever you are outdoors.  Once a month, do a whole-body skin exam, using a mirror to look  at the skin on your back. Tell your health care provider about new moles, moles that have irregular borders, moles that are larger than a pencil eraser, or moles that have changed in shape or color.  Stay current with required vaccines (immunizations).  Influenza vaccine. All adults should be immunized every year.  Tetanus, diphtheria, and acellular pertussis (Td, Tdap) vaccine. An adult who has not previously received Tdap or who does not know his vaccine status should receive 1 dose of Tdap. This initial dose should be followed by tetanus and diphtheria toxoids (Td) booster doses every 10 years. Adults with an unknown or incomplete history of completing a 3-dose immunization series with Td-containing vaccines should begin or complete a primary immunization series including a Tdap dose. Adults should receive a Td booster every 10 years.  Varicella vaccine. An adult without evidence of immunity to varicella should receive 2 doses or a second dose if he has previously received 1 dose.  Human papillomavirus (HPV) vaccine. Males aged 29-21 years who have not received the vaccine previously should receive the 3-dose series. Males aged 22-26 years may be immunized. Immunization is recommended through the age of 26 years for any male who has sex with males and did not get any or all doses earlier. Immunization is recommended for any  person with an immunocompromised condition through the age of 29 years if he did not get any or all doses earlier. During the 3-dose series, the second dose should be obtained 4-8 weeks after the first dose. The third dose should be obtained 24 weeks after the first dose and 16 weeks after the second dose.  Zoster vaccine. One dose is recommended for adults aged 38 years or older unless certain conditions are present.  Measles, mumps, and rubella (MMR) vaccine. Adults born before [birth year] generally are considered immune to measles and mumps. Adults born in [birth year] or later should have 1 or more doses of MMR vaccine unless there is a contraindication to the vaccine or there is laboratory evidence of immunity to each of the three diseases. A routine second dose of MMR vaccine should be obtained at least 28 days after the first dose for students attending postsecondary schools, health care workers, or international travelers. People who received inactivated measles vaccine or an unknown type of measles vaccine during 1963-1967 should receive 2 doses of MMR vaccine. People who received inactivated mumps vaccine or an unknown type of mumps vaccine before 1979 and are at high risk for mumps infection should consider immunization with 2 doses of MMR vaccine. Unvaccinated health care workers born before [birth year] who lack laboratory evidence of measles, mumps, or rubella immunity or laboratory confirmation of disease should consider measles and mumps immunization with 2 doses of MMR vaccine or rubella immunization with 1 dose of MMR vaccine.  Pneumococcal 13-valent conjugate (PCV13) vaccine. When indicated, a person who is uncertain of his immunization history and has no record of immunization should receive the PCV13 vaccine. An adult aged 68 years or older who has certain medical conditions and has not been previously immunized should receive 1 dose of PCV13 vaccine. This PCV13 should be followed with a dose of pneumococcal  polysaccharide (PPSV23) vaccine. The PPSV23 vaccine dose should be obtained at least 8 weeks after the dose of PCV13 vaccine. An adult aged 95 years or older who has certain medical conditions and previously received 1 or more doses of PPSV23 vaccine should receive 1 dose of PCV13. The PCV13 vaccine dose should be obtained 1  or more years after the last PPSV23 vaccine dose.  Pneumococcal polysaccharide (PPSV23) vaccine. When PCV13 is also indicated, PCV13 should be obtained first. All adults aged 44 years and older should be immunized. An adult younger than age 3 years who has certain medical conditions should be immunized. Any person who resides in a nursing home or long-term care facility should be immunized. An adult smoker should be immunized. People with an immunocompromised condition and certain other conditions should receive both PCV13 and PPSV23 vaccines. People with human immunodeficiency virus (HIV) infection should be immunized as soon as possible after diagnosis. Immunization during chemotherapy or radiation therapy should be avoided. Routine use of PPSV23 vaccine is not recommended for American Indians, [address], or people younger than 65 years unless there are medical conditions that require PPSV23 vaccine. When indicated, people who have unknown immunization and have no record of immunization should receive PPSV23 vaccine. One-time revaccination 5 years after the first dose of PPSV23 is recommended for people aged 19-64 years who have chronic kidney failure, nephrotic syndrome, asplenia, or immunocompromised conditions. People who received 1-2 doses of PPSV23 before age 70 years should receive another dose of PPSV23 vaccine at age 52 years or later if at least 5 years have passed since the previous dose. Doses of PPSV23 are not needed for people immunized with PPSV23 at or after age 43 years.  Meningococcal vaccine. Adults with asplenia or persistent complement component deficiencies  should receive 2 doses of quadrivalent meningococcal conjugate (MenACWY-D) vaccine. The doses should be obtained at least 2 months apart. Microbiologists working with certain meningococcal bacteria, military recruits, people at risk during an outbreak, and people who travel to or live in countries with a high rate of meningitis should be immunized. A first-year college student up through age 74 years who is living in a residence hall should receive a dose if he did not receive a dose on or after his 16th birthday. Adults who have certain high-risk conditions should receive one or more doses of vaccine.  Hepatitis A vaccine. Adults who wish to be protected from this disease, have certain high-risk conditions, work with hepatitis A-infected animals, work in hepatitis A research labs, or travel to or work in countries with a high rate of hepatitis A should be immunized. Adults who were previously unvaccinated and who anticipate close contact with an international adoptee during the first 60 days after arrival in the [place] from a country with a high rate of hepatitis A should be immunized.  Hepatitis B vaccine. Adults should be immunized if they wish to be protected from this disease, have certain high-risk conditions, may be exposed to blood or other infectious body fluids, are household contacts or sex partners of hepatitis B positive people, are clients or workers in certain care facilities, or travel to or work in countries with a high rate of hepatitis B.  Haemophilus influenzae type b (Hib) vaccine. A previously unvaccinated person with asplenia or sickle cell disease or having a scheduled splenectomy should receive 1 dose of Hib vaccine. Regardless of previous immunization, a recipient of a hematopoietic stem cell transplant should receive a 3-dose series 6-12 months after his successful transplant. Hib vaccine is not recommended for adults with HIV infection. Preventive Service /  Frequency  . Ages 37 and over  Blood pressure check.** / Every 1 to 2 years.  Lipid and cholesterol check.**/ Every 5 years beginning at age 45.  Lung cancer screening. / Every year if you are aged  55-80 years and have a 30-pack-year history of smoking and currently smoke or have quit within the past 15 years. Yearly screening is stopped once you have quit smoking for at least 15 years or develop a health problem that would prevent you from having lung cancer treatment.  Fecal occult blood test (FOBT) of stool. / Every year beginning at age 35 and continuing until age 58. You may not have to do this test if you get a colonoscopy every 10 years.  Flexible sigmoidoscopy** or colonoscopy.** / Every 5 years for a flexible sigmoidoscopy or every 10 years for a colonoscopy beginning at age 65 and continuing until age 35.  Hepatitis C blood test.** / For all people born from [birth year] through 1964 and any individual with known risks for hepatitis C.  Abdominal aortic aneurysm (AAA) screening for persons with Hypertension or who are current or former smokers.  Skin self-exam. / Monthly.  Influenza vaccine. / Every year.  Tetanus, diphtheria, and acellular pertussis (Tdap/Td) vaccine.** / 1 dose of Td every 10 years.  Varicella vaccine.** / Consult your health care provider.  Zoster vaccine.** / 1 dose for adults aged 75 years or older.  Pneumococcal 13-valent conjugate (PCV13) vaccine.** / Consult your health care provider.  Pneumococcal polysaccharide (PPSV23) vaccine.** / 1 dose for all adults aged 83 years and older.  Meningococcal vaccine.** / Consult your health care provider.  Hepatitis A vaccine.** / Consult your health care provider.  Hepatitis B vaccine.** / Consult your health care provider.  Haemophilus influenzae type b (Hib) vaccine.** / Consult your health care provider.

## 2013-11-15 ENCOUNTER — Ambulatory Visit (INDEPENDENT_AMBULATORY_CARE_PROVIDER_SITE_OTHER): Payer: Medicare Other | Admitting: Internal Medicine

## 2013-11-15 ENCOUNTER — Encounter: Payer: Self-pay | Admitting: Internal Medicine

## 2013-11-15 VITALS — BP 124/76 | HR 64 | Temp 97.3°F | Resp 16 | Ht 68.0 in | Wt 174.0 lb

## 2013-11-15 DIAGNOSIS — E559 Vitamin D deficiency, unspecified: Secondary | ICD-10-CM

## 2013-11-15 DIAGNOSIS — E785 Hyperlipidemia, unspecified: Secondary | ICD-10-CM

## 2013-11-15 DIAGNOSIS — Z1331 Encounter for screening for depression: Secondary | ICD-10-CM

## 2013-11-15 DIAGNOSIS — I10 Essential (primary) hypertension: Secondary | ICD-10-CM

## 2013-11-15 DIAGNOSIS — Z789 Other specified health status: Secondary | ICD-10-CM

## 2013-11-15 DIAGNOSIS — Z1212 Encounter for screening for malignant neoplasm of rectum: Secondary | ICD-10-CM

## 2013-11-15 DIAGNOSIS — Z0000 Encounter for general adult medical examination without abnormal findings: Secondary | ICD-10-CM

## 2013-11-15 DIAGNOSIS — Z79899 Other long term (current) drug therapy: Secondary | ICD-10-CM

## 2013-11-15 DIAGNOSIS — R7309 Other abnormal glucose: Secondary | ICD-10-CM

## 2013-11-15 DIAGNOSIS — Z125 Encounter for screening for malignant neoplasm of prostate: Secondary | ICD-10-CM

## 2013-11-15 LAB — CBC WITH DIFFERENTIAL/PLATELET
BASOS ABS: 0 10*3/uL (ref 0.0–0.1)
BASOS PCT: 0 % (ref 0–1)
Eosinophils Absolute: 0.2 10*3/uL (ref 0.0–0.7)
Eosinophils Relative: 3 % (ref 0–5)
HEMATOCRIT: 42 % (ref 39.0–52.0)
HEMOGLOBIN: 14.6 g/dL (ref 13.0–17.0)
LYMPHS PCT: 33 % (ref 12–46)
Lymphs Abs: 2.2 10*3/uL (ref 0.7–4.0)
MCH: 32.4 pg (ref 26.0–34.0)
MCHC: 34.8 g/dL (ref 30.0–36.0)
MCV: 93.3 fL (ref 78.0–100.0)
MONOS PCT: 9 % (ref 3–12)
Monocytes Absolute: 0.6 10*3/uL (ref 0.1–1.0)
NEUTROS ABS: 3.7 10*3/uL (ref 1.7–7.7)
NEUTROS PCT: 55 % (ref 43–77)
Platelets: 216 10*3/uL (ref 150–400)
RBC: 4.5 MIL/uL (ref 4.22–5.81)
RDW: 13.7 % (ref 11.5–15.5)
WBC: 6.8 10*3/uL (ref 4.0–10.5)

## 2013-11-15 LAB — HEMOGLOBIN A1C
Hgb A1c MFr Bld: 5.7 % — ABNORMAL HIGH (ref <5.7)
MEAN PLASMA GLUCOSE: 117 mg/dL — AB (ref <117)

## 2013-11-15 MED ORDER — ALPRAZOLAM 0.5 MG PO TABS: ORAL_TABLET | ORAL | Status: DC

## 2013-11-16 LAB — LIPID PANEL
CHOL/HDL RATIO: 3.2 ratio
CHOLESTEROL: 131 mg/dL (ref 0–200)
HDL: 41 mg/dL (ref >39)
LDL Cholesterol: 70 mg/dL (ref 0–99)
TRIGLYCERIDES: 98 mg/dL (ref <150)
VLDL: 20 mg/dL (ref 0–40)

## 2013-11-16 LAB — HEPATIC FUNCTION PANEL
ALK PHOS: 92 U/L (ref 39–117)
ALT: 11 U/L (ref 0–53)
AST: 17 U/L (ref 0–37)
Albumin: 4.6 g/dL (ref 3.5–5.2)
BILIRUBIN DIRECT: 0.3 mg/dL (ref 0.0–0.3)
BILIRUBIN INDIRECT: 1 mg/dL (ref 0.2–1.2)
BILIRUBIN TOTAL: 1.3 mg/dL — AB (ref 0.2–1.2)
Total Protein: 7.1 g/dL (ref 6.0–8.3)

## 2013-11-16 LAB — MAGNESIUM: Magnesium: 1.9 mg/dL (ref 1.5–2.5)

## 2013-11-16 LAB — BASIC METABOLIC PANEL WITH GFR
BUN: 16 mg/dL (ref 6–23)
CHLORIDE: 102 meq/L (ref 96–112)
CO2: 24 mEq/L (ref 19–32)
Calcium: 10.4 mg/dL (ref 8.4–10.5)
Creat: 1.16 mg/dL (ref 0.50–1.35)
GFR, EST AFRICAN AMERICAN: 75 mL/min
GFR, EST NON AFRICAN AMERICAN: 65 mL/min
Glucose, Bld: 102 mg/dL — ABNORMAL HIGH (ref 70–99)
POTASSIUM: 4.8 meq/L (ref 3.5–5.3)
SODIUM: 136 meq/L (ref 135–145)

## 2013-11-16 LAB — URINALYSIS, MICROSCOPIC ONLY
BACTERIA UA: NONE SEEN
CASTS: NONE SEEN
CRYSTALS: NONE SEEN
Squamous Epithelial / LPF: NONE SEEN

## 2013-11-16 LAB — MICROALBUMIN / CREATININE URINE RATIO
Creatinine, Urine: 175.9 mg/dL
MICROALB/CREAT RATIO: 3.9 mg/g (ref 0.0–30.0)
Microalb, Ur: 0.69 mg/dL (ref 0.00–1.89)

## 2013-11-16 LAB — VITAMIN D 25 HYDROXY (VIT D DEFICIENCY, FRACTURES): Vit D, 25-Hydroxy: 49 ng/mL (ref 30–89)

## 2013-11-16 LAB — TSH: TSH: 3.761 u[IU]/mL (ref 0.350–4.500)

## 2013-11-16 LAB — PSA: PSA: 0.38 ng/mL (ref <=4.00)

## 2013-11-16 LAB — INSULIN, FASTING: Insulin fasting, serum: 7.9 u[IU]/mL (ref 2.0–19.6)

## 2014-03-01 ENCOUNTER — Ambulatory Visit: Payer: Self-pay | Admitting: Physician Assistant

## 2014-03-21 ENCOUNTER — Encounter: Payer: Self-pay | Admitting: Physician Assistant

## 2014-03-21 ENCOUNTER — Ambulatory Visit (INDEPENDENT_AMBULATORY_CARE_PROVIDER_SITE_OTHER): Payer: Medicare Other | Admitting: Physician Assistant

## 2014-03-21 VITALS — BP 122/68 | HR 60 | Temp 98.1°F | Resp 16 | Ht 68.0 in | Wt 180.0 lb

## 2014-03-21 DIAGNOSIS — Z79899 Other long term (current) drug therapy: Secondary | ICD-10-CM

## 2014-03-21 DIAGNOSIS — Z1331 Encounter for screening for depression: Secondary | ICD-10-CM

## 2014-03-21 DIAGNOSIS — N182 Chronic kidney disease, stage 2 (mild): Secondary | ICD-10-CM | POA: Insufficient documentation

## 2014-03-21 DIAGNOSIS — I10 Essential (primary) hypertension: Secondary | ICD-10-CM

## 2014-03-21 DIAGNOSIS — Z9181 History of falling: Secondary | ICD-10-CM

## 2014-03-21 DIAGNOSIS — I2510 Atherosclerotic heart disease of native coronary artery without angina pectoris: Secondary | ICD-10-CM

## 2014-03-21 DIAGNOSIS — R7303 Prediabetes: Secondary | ICD-10-CM

## 2014-03-21 DIAGNOSIS — I6203 Nontraumatic chronic subdural hemorrhage: Secondary | ICD-10-CM

## 2014-03-21 DIAGNOSIS — E785 Hyperlipidemia, unspecified: Secondary | ICD-10-CM

## 2014-03-21 DIAGNOSIS — R6889 Other general symptoms and signs: Secondary | ICD-10-CM

## 2014-03-21 DIAGNOSIS — Z8679 Personal history of other diseases of the circulatory system: Secondary | ICD-10-CM

## 2014-03-21 DIAGNOSIS — E559 Vitamin D deficiency, unspecified: Secondary | ICD-10-CM

## 2014-03-21 DIAGNOSIS — Z0001 Encounter for general adult medical examination with abnormal findings: Secondary | ICD-10-CM

## 2014-03-21 DIAGNOSIS — R7309 Other abnormal glucose: Secondary | ICD-10-CM

## 2014-03-21 LAB — CBC WITH DIFFERENTIAL/PLATELET
BASOS ABS: 0 10*3/uL (ref 0.0–0.1)
BASOS PCT: 0 % (ref 0–1)
EOS PCT: 2 % (ref 0–5)
Eosinophils Absolute: 0.1 10*3/uL (ref 0.0–0.7)
HCT: 44.3 % (ref 39.0–52.0)
HEMOGLOBIN: 14.9 g/dL (ref 13.0–17.0)
Lymphocytes Relative: 32 % (ref 12–46)
Lymphs Abs: 2.3 10*3/uL (ref 0.7–4.0)
MCH: 31.8 pg (ref 26.0–34.0)
MCHC: 33.6 g/dL (ref 30.0–36.0)
MCV: 94.7 fL (ref 78.0–100.0)
MONO ABS: 0.6 10*3/uL (ref 0.1–1.0)
MPV: 11.2 fL (ref 8.6–12.4)
Monocytes Relative: 9 % (ref 3–12)
NEUTROS PCT: 57 % (ref 43–77)
Neutro Abs: 4.1 10*3/uL (ref 1.7–7.7)
PLATELETS: 223 10*3/uL (ref 150–400)
RBC: 4.68 MIL/uL (ref 4.22–5.81)
RDW: 13.6 % (ref 11.5–15.5)
WBC: 7.2 10*3/uL (ref 4.0–10.5)

## 2014-03-21 LAB — HEMOGLOBIN A1C
HEMOGLOBIN A1C: 5.7 % — AB (ref <5.7)
Mean Plasma Glucose: 117 mg/dL — ABNORMAL HIGH (ref <117)

## 2014-03-21 NOTE — Patient Instructions (Signed)

## 2014-03-21 NOTE — Progress Notes (Signed)
MEDICARE ANNUAL WELLNESS VISIT AND FOLLOW UP Assessment:   1. Essential hypertension - continue medications, DASH diet, exercise and monitor at home. Call if greater than 130/80.  - CBC with Differential - Hepatic function panel - TSH  2. Hyperlipidemia -continue medications, check lipids, decrease fatty foods, increase activity.  - Lipid panel  3. Prediabetes Discussed general issues about diabetes pathophysiology and management., Educational material distributed., Suggested low cholesterol diet., Encouraged aerobic exercise., Discussed foot care., Reminded to get yearly retinal exam. - Hemoglobin A1c - Insulin, fasting - HM DIABETES FOOT EXAM  4. Vitamin D deficiency - Vit D  25 hydroxy (rtn osteoporosis monitoring)  5. Medication management - Magnesium  6. Subdural hematoma, chronic Control HTN  7. Coronary artery disease involving native coronary artery of native heart without angina pectoris Control blood pressure, cholesterol, glucose, increase exercise.  Continue cardio follow up  8. H/O carotid artery stenosis Control blood pressure, cholesterol, glucose, increase exercise.  Continue cardio follow up  9. CKD (chronic kidney disease) stage 2, GFR 60-89 ml/min - BASIC METABOLIC PANEL WITH GFR -Increase fluids, avoid NSAIDS, monitor sugars, will monitor   Plan:   During the course of the visit the patient was educated and counseled about appropriate screening and preventive services including:    Pneumococcal vaccine   Influenza vaccine  Td vaccine  Screening electrocardiogram  Colorectal cancer screening  Diabetes screening  Glaucoma screening  Nutrition counseling   Screening recommendations, referrals: Vaccinations: Please see documentation below and orders this visit.  Nutrition assessed and recommended  Colonoscopy due 2019 Recommended yearly ophthalmology/optometry visit for glaucoma screening and checkup Recommended yearly dental visit  for hygiene and checkup Advanced directives - requested  Conditions/risks identified: BMI: Discussed weight loss, diet, and increase physical activity.  Increase physical activity: AHA recommends 150 minutes of physical activity a week.  Medications reviewed Diabetes is at goal, ACE/ARB therapy: Yes. Urinary Incontinence is not an issue: discussed non pharmacology and pharmacology options.  Fall risk: low- discussed PT, home fall assessment, medications.    Subjective:  Andre Warren is a 69 y.o. male who presents for Medicare Annual Wellness Visit and 3 month follow up for HTN, hyperlipidemia, prediabetes, and vitamin D Def.  Date of last medicare wellness visit was is unknown.  His blood pressure has been controlled at home, today their BP is BP: 122/68 mmHg He does workout, karate, walking, and kettle bells. He denies chest pain, shortness of breath, dizziness.  He has CKD stage II due to HTN.  He has history of CAD, MI in 2000 with CABGx3 in 2004, he follows with the [facility] for this. Denies CP, SOB. History of right CEA in 2004, monitoring left carotid. He is on bASA. He has a history of subdural hematoma due to uncontrolled BP in 2014, had follow up with Dr. Danielle Dess, states he continues to have some left leg numbness/tingling, worse with walking.  He is on cholesterol medication, lipitor , takes 1/2 daily and denies myalgias. His cholesterol is at goal. The cholesterol last visit was:   Lab Results  Component Value Date   CHOL 131 11/15/2013   HDL 41 11/15/2013   LDLCALC 70 11/15/2013   TRIG 98 11/15/2013   CHOLHDL 3.2 11/15/2013  He has been working on diet and exercise for prediabetes, and denies paresthesia of the feet, polydipsia, polyuria and visual disturbances. Last A1C in the office was:  Lab Results  Component Value Date   HGBA1C 5.7* 11/15/2013  Patient is on  Vitamin D supplement.   Lab Results  Component Value Date   VD25OH 49 11/15/2013     Names of  Other Physician/Practitioners you currently use: 1. [facility] here for primary care 2. Dr. Harriette Bouillon, eye doctor, last visit yearly 3. Dr. Jon Billings, dentist, last visit PRN Patient Care Team: [name], MD as PCP - General (Internal Medicine)  Dr. Danielle Dess, neurosugeon.   Medication Review: Current Outpatient Prescriptions on File Prior to Visit  Medication Sig Dispense Refill  . ALPRAZolam (XANAX) 0.5 MG tablet Take 1/2 to 1 tablet if needed for anxiety 90 tablet 5  . atorvastatin (LIPITOR) 80 MG tablet Take 40 mg by mouth daily.    Marland Kitchen BABY ASPIRIN PO Take 81 mg by mouth daily.    . bisoprolol-hydrochlorothiazide (ZIAC) 5-6.25 MG per tablet Take 1 tablet by mouth daily.    . Cholecalciferol (VITAMIN D PO) Take 8,000 Int'l Units by mouth daily.    . isosorbide mononitrate (IMDUR) 30 MG 24 hr tablet Take 30 mg by mouth daily.    Marland Kitchen losartan (COZAAR) 50 MG tablet Take 25 mg by mouth daily. One half tablet daily    . metoprolol (LOPRESSOR) 50 MG tablet Take 0.5 tablets (25 mg total) by mouth 2 (two) times daily. Take one half tablet 60 tablet 0   No current facility-administered medications on file prior to visit.    Current Problems (verified) Patient Active Problem List   Diagnosis Date Noted  . Other abnormal glucose 05/08/2013  . Vitamin D Deficiency 05/08/2013  . Encounter for long-term (current) use of other medications 05/08/2013  . Hypertension   . Subdural hematoma, chronic 03/15/2012  . CAD (coronary artery disease) 03/15/2012  . Hyperlipidemia 03/15/2012    Screening Tests Health Maintenance  Topic Date Due  . ZOSTAVAX  04/20/2006  . INFLUENZA VACCINE  10/08/2013  . COLONOSCOPY  10/25/2017  . TETANUS/TDAP  08/08/2020  . PNEUMOCOCCAL POLYSACCHARIDE VACCINE AGE 22 AND OVER  Completed    Immunization History  Administered Date(s) Administered  . Pneumococcal Polysaccharide-23 04/20/1995  . Pneumococcal-Unspecified  03/10/2012  . Tdap 08/09/2010   Preventative care: Last colonoscopy: 2009 due 2019 Cardiolite 2005 Echo 2008  Prior vaccinations: TD or Tdap: 2012  Influenza: declines  Pneumococcal: 2014 Prevnar13: DUE Shingles/Zostavax: declines    Medication List       This list is accurate as of: 03/21/14 11:36 AM.  Always use your most recent med list.               ALPRAZolam 0.5 MG tablet  Commonly known as:  XANAX  Take 1/2 to 1 tablet if needed for anxiety     atorvastatin 80 MG tablet  Commonly known as:  LIPITOR  Take 40 mg by mouth daily.     BABY ASPIRIN PO  Take 81 mg by mouth daily.     bisoprolol-hydrochlorothiazide 5-6.25 MG per tablet  Commonly known as:  ZIAC  Take 1 tablet by mouth daily.     isosorbide mononitrate 30 MG 24 hr tablet  Commonly known as:  IMDUR  Take 30 mg by mouth daily.     losartan 50 MG tablet  Commonly known as:  COZAAR  Take 25 mg by mouth daily. One half tablet daily     metoprolol 50 MG tablet  Commonly known as:  LOPRESSOR  Take 0.5 tablets (25 mg total) by mouth 2 (two) times daily. Take one half tablet     VITAMIN D PO  Take 8,000 Int'l Units by mouth daily.        Past Surgical History  Procedure Laterality Date  . Coronary angioplasty with stent placement      Became occluded and resulted in CABG  . Carotid endarterectomy  2004  . Coronary artery bypass graft      x 3   Family History  Problem Relation Age of Onset  . Hypertension Mother   . Heart disease Mother   . Hypertension Father   . Stroke Father   . Heart disease Father   . Cancer Father   . Cancer Sister   . Heart disease Sister     History reviewed: allergies, current medications, past family history, past medical history, past social history, past surgical history and problem list  Risk Factors: Tobacco History  Substance Use Topics  . Smoking status: Never Smoker   . Smokeless tobacco: Not on file  . Alcohol Use: No   He does not smoke.   Patient is not a former smoker. Are there smokers in your home (other than you)?  No  Alcohol Current alcohol use: none  Caffeine Current caffeine use: coffee 2 /day  Exercise Current exercise: walking and weightlifting  Nutrition/Diet Current diet: in general, a "healthy" diet    Cardiac risk factors: advanced age (older than 23 for men, 72 for women), dyslipidemia, hypertension, male gender and sedentary lifestyle.  Depression Screen (Note: if answer to either of the following is "Yes", a more complete depression screening is indicated)   Q1: Over the past two weeks, have you felt down, depressed or hopeless? No  Q2: Over the past two weeks, have you felt little interest or pleasure in doing things? No  Have you lost interest or pleasure in daily life? No  Do you often feel hopeless? No  Do you cry easily over simple problems? No  Activities of Daily Living In your present state of health, do you have any difficulty performing the following activities?:  Driving? No Managing money?  No Feeding yourself? No Getting from bed to chair? No Climbing a flight of stairs? No Preparing food and eating?: No Bathing or showering? No Getting dressed: No Getting to the toilet? No Using the toilet:No Moving around from place to place: No In the past year have you fallen or had a near fall?:No   Are you sexually active?  No  Do you have more than one partner?  No  Vision Difficulties: No  Hearing Difficulties: No Do you often ask people to speak up or repeat themselves? No Do you experience ringing or noises in your ears? No Do you have difficulty understanding soft or whispered voices? No  Cognition  Do you feel that you have a problem with memory?No  Do you often misplace items? No  Do you feel safe at home?  Yes  Advanced directives Does patient have a Health Care Power of Attorney? Yes Does patient have a Living Will? Yes   Objective:   Blood pressure 122/68,  pulse 60, temperature 98.1 F (36.7 C), resp. rate 16, height 5\' 8"  (1.727 m), weight 180 lb (81.647 kg). Body mass index is 27.38 kg/(m^2).  General appearance: alert, no distress, WD/WN, male Cognitive Testing  Alert? Yes  Normal Appearance?Yes  Oriented to person? Yes  Place? Yes   Time? Yes  Recall of three objects?  Yes  Can perform simple calculations? Yes  Displays appropriate judgment?Yes  Can read the correct time from a watch face?Yes  HEENT: normocephalic, sclerae anicteric, TMs pearly, nares patent, no discharge or erythema, pharynx normal Oral cavity: MMM, no lesions Neck: supple, no lymphadenopathy, no thyromegaly, no masses, well healing CEA scare right neck Heart: RRR, normal S1, S2, no murmurs Lungs: CTA bilaterally, no wheezes, rhonchi, or rales Abdomen: +bs, soft, obese non tender, non distended, no masses, no hepatomegaly, no splenomegaly Musculoskeletal: nontender, no swelling, no obvious deformity Extremities: no edema, no cyanosis, no clubbing Pulses: 2+ symmetric, upper and lower extremities, normal cap refill Neurological: alert, oriented x 3, CN2-12 intact, strength normal upper extremities and lower extremities, sensation normal throughout, DTRs 2+ throughout, no cerebellar signs, gait normal Psychiatric: normal affect, behavior normal, pleasant   Medicare Attestation I have personally reviewed: The patient's medical and social history Their use of alcohol, tobacco or illicit drugs Their current medications and supplements The patient's functional ability including ADLs,fall risks, home safety risks, cognitive, and hearing and visual impairment Diet and physical activities Evidence for depression or mood disorders  The patient's weight, height, BMI, and visual acuity have been recorded in the chart.  I have made referrals, counseling, and provided education to the patient based on review of the above and I have provided the patient with a written  personalized care plan for preventive services.     Quentin Mulling, PA-C   03/21/2014

## 2014-03-22 LAB — LIPID PANEL
CHOL/HDL RATIO: 3.7 ratio
Cholesterol: 141 mg/dL (ref 0–200)
HDL: 38 mg/dL — AB (ref >39)
LDL CALC: 83 mg/dL (ref 0–99)
Triglycerides: 99 mg/dL (ref <150)
VLDL: 20 mg/dL (ref 0–40)

## 2014-03-22 LAB — HEPATIC FUNCTION PANEL
ALBUMIN: 4.6 g/dL (ref 3.5–5.2)
ALT: 12 U/L (ref 0–53)
AST: 16 U/L (ref 0–37)
Alkaline Phosphatase: 84 U/L (ref 39–117)
BILIRUBIN TOTAL: 1.4 mg/dL — AB (ref 0.2–1.2)
Bilirubin, Direct: 0.3 mg/dL (ref 0.0–0.3)
Indirect Bilirubin: 1.1 mg/dL (ref 0.2–1.2)
TOTAL PROTEIN: 7.6 g/dL (ref 6.0–8.3)

## 2014-03-22 LAB — VITAMIN D 25 HYDROXY (VIT D DEFICIENCY, FRACTURES): VIT D 25 HYDROXY: 34 ng/mL (ref 30–100)

## 2014-03-22 LAB — BASIC METABOLIC PANEL WITH GFR
BUN: 14 mg/dL (ref 6–23)
CALCIUM: 10 mg/dL (ref 8.4–10.5)
CO2: 28 meq/L (ref 19–32)
CREATININE: 1.16 mg/dL (ref 0.50–1.35)
Chloride: 102 mEq/L (ref 96–112)
GFR, EST AFRICAN AMERICAN: 75 mL/min
GFR, Est Non African American: 65 mL/min
GLUCOSE: 101 mg/dL — AB (ref 70–99)
POTASSIUM: 4.5 meq/L (ref 3.5–5.3)
SODIUM: 138 meq/L (ref 135–145)

## 2014-03-22 LAB — MAGNESIUM: MAGNESIUM: 2 mg/dL (ref 1.5–2.5)

## 2014-03-22 LAB — INSULIN, FASTING: Insulin fasting, serum: 6.6 u[IU]/mL (ref 2.0–19.6)

## 2014-03-22 LAB — TSH: TSH: 3.528 u[IU]/mL (ref 0.350–4.500)

## 2014-06-01 ENCOUNTER — Ambulatory Visit: Payer: Self-pay | Admitting: Internal Medicine

## 2014-06-12 ENCOUNTER — Encounter: Payer: Self-pay | Admitting: Internal Medicine

## 2014-06-12 ENCOUNTER — Ambulatory Visit (INDEPENDENT_AMBULATORY_CARE_PROVIDER_SITE_OTHER): Payer: Medicare Other | Admitting: Internal Medicine

## 2014-06-12 VITALS — BP 144/82 | HR 64 | Temp 97.3°F | Resp 16 | Ht 68.0 in | Wt 182.8 lb

## 2014-06-12 DIAGNOSIS — I2510 Atherosclerotic heart disease of native coronary artery without angina pectoris: Secondary | ICD-10-CM

## 2014-06-12 DIAGNOSIS — E559 Vitamin D deficiency, unspecified: Secondary | ICD-10-CM

## 2014-06-12 DIAGNOSIS — E785 Hyperlipidemia, unspecified: Secondary | ICD-10-CM

## 2014-06-12 DIAGNOSIS — R7303 Prediabetes: Secondary | ICD-10-CM

## 2014-06-12 DIAGNOSIS — Z79899 Other long term (current) drug therapy: Secondary | ICD-10-CM

## 2014-06-12 DIAGNOSIS — R7309 Other abnormal glucose: Secondary | ICD-10-CM

## 2014-06-12 DIAGNOSIS — I10 Essential (primary) hypertension: Secondary | ICD-10-CM

## 2014-06-12 LAB — HEPATIC FUNCTION PANEL
ALT: 13 U/L (ref 0–53)
AST: 16 U/L (ref 0–37)
Albumin: 4.4 g/dL (ref 3.5–5.2)
Alkaline Phosphatase: 77 U/L (ref 39–117)
BILIRUBIN TOTAL: 1.1 mg/dL (ref 0.2–1.2)
Bilirubin, Direct: 0.2 mg/dL (ref 0.0–0.3)
Indirect Bilirubin: 0.9 mg/dL (ref 0.2–1.2)
TOTAL PROTEIN: 6.8 g/dL (ref 6.0–8.3)

## 2014-06-12 LAB — CBC WITH DIFFERENTIAL/PLATELET
BASOS PCT: 0 % (ref 0–1)
Basophils Absolute: 0 10*3/uL (ref 0.0–0.1)
Eosinophils Absolute: 0.3 10*3/uL (ref 0.0–0.7)
Eosinophils Relative: 4 % (ref 0–5)
HEMATOCRIT: 41.2 % (ref 39.0–52.0)
Hemoglobin: 13.9 g/dL (ref 13.0–17.0)
LYMPHS PCT: 24 % (ref 12–46)
Lymphs Abs: 1.8 10*3/uL (ref 0.7–4.0)
MCH: 32 pg (ref 26.0–34.0)
MCHC: 33.7 g/dL (ref 30.0–36.0)
MCV: 94.7 fL (ref 78.0–100.0)
MONO ABS: 0.9 10*3/uL (ref 0.1–1.0)
MPV: 11.4 fL (ref 8.6–12.4)
Monocytes Relative: 12 % (ref 3–12)
NEUTROS ABS: 4.4 10*3/uL (ref 1.7–7.7)
NEUTROS PCT: 60 % (ref 43–77)
PLATELETS: 193 10*3/uL (ref 150–400)
RBC: 4.35 MIL/uL (ref 4.22–5.81)
RDW: 14.2 % (ref 11.5–15.5)
WBC: 7.4 10*3/uL (ref 4.0–10.5)

## 2014-06-12 LAB — LIPID PANEL
CHOL/HDL RATIO: 3.1 ratio
CHOLESTEROL: 123 mg/dL (ref 0–200)
HDL: 40 mg/dL (ref >=40)
LDL Cholesterol: 69 mg/dL (ref 0–99)
Triglycerides: 68 mg/dL (ref <150)
VLDL: 14 mg/dL (ref 0–40)

## 2014-06-12 LAB — BASIC METABOLIC PANEL WITH GFR
BUN: 16 mg/dL (ref 6–23)
CO2: 27 meq/L (ref 19–32)
Calcium: 9.7 mg/dL (ref 8.4–10.5)
Chloride: 105 mEq/L (ref 96–112)
Creat: 1.11 mg/dL (ref 0.50–1.35)
GFR, EST AFRICAN AMERICAN: 78 mL/min
GFR, Est Non African American: 68 mL/min
Glucose, Bld: 96 mg/dL (ref 70–99)
Potassium: 4.6 mEq/L (ref 3.5–5.3)
Sodium: 140 mEq/L (ref 135–145)

## 2014-06-12 LAB — HEMOGLOBIN A1C
Hgb A1c MFr Bld: 5.8 % — ABNORMAL HIGH (ref <5.7)
MEAN PLASMA GLUCOSE: 120 mg/dL — AB (ref <117)

## 2014-06-12 LAB — MAGNESIUM: Magnesium: 1.9 mg/dL (ref 1.5–2.5)

## 2014-06-12 NOTE — Patient Instructions (Signed)

## 2014-06-12 NOTE — Progress Notes (Signed)
Patient ID: Andre Warren, male   DOB: Nov 01, 1945, 69 y.o.   MRN: [id number]   This very nice 69 y.o. MWM presents for 3 month follow up with Hypertension, Hyperlipidemia, Pre-Diabetes and Vitamin D Deficiency.    Patient is treated for HTN & BP has been controlled at home. Today's BP is sl elevated at 144/82. In 2000, patient presented with ACS and had PTCA/stenting and ultimately in 2004 had a CABG & assoc Rt.CEA. Patient had a negative Cardiolyte in 2011. Patient has done well since & has had no complaints of any cardiac type chest pain, palpitations, dyspnea/orthopnea/PND, dizziness, claudication, or dependent edema.   Hyperlipidemia is controlled with diet & meds. Patient denies myalgias or other med SE's. Last Lipids were at goal - Total Chol 141; HDL 38; LDL 83; Trig 99 on 03/21/2014.   Also, the patient has PreDiabetes and has had no symptoms of reactive hypoglycemia, diabetic polys, paresthesias or visual blurring.  Last A1c was 5.7% on 03/21/2014   Further, the patient also has history of Vitamin D Deficiency of 36 in 2008 and supplements vitamin D without any suspected side-effects. Last vitamin D was 34 on 03/21/2014.   Medication Sig  . ALPRAZolam  0.5 MG tablet Take 1/2 to 1 tablet if needed for anxiety  . atorvastatin  80 MG tablet Take 40 mg by mouth daily.  Marland Kitchen BABY ASPIRIN  Take 81 mg by mouth daily.  . bisoprolol-hctz (ZIAC) 5-6.25 MG per tablet Take 1 tablet by mouth daily.  Marland Kitchen VITAMIN D  Take 8,000 Int'l Units by mouth daily.  . isosorbide mononitrate (IMDUR) 30 MG  Take 30 mg  daily.  Marland Kitchen losartan (COZAAR) 50 MG tablet Take 25 mg  daily.   . metoprolol tartrate 50 MG tablet Take 0.5 tab 2  times daily.    Allergies  Allergen Reactions  . Latex Itching   PMHx:   Past Medical History  Diagnosis Date  . CAD (coronary artery disease)   . Hypercholesteremia   . Hypertension   . Subdural hematoma, post-traumatic 03/2012    Saw Dr. Danielle Dess has been released.   . Peripheral  arterial disease     LCEA   Immunization History  Administered Date(s) Administered  . Pneumococcal Polysaccharide-23 04/20/1995  . Pneumococcal-Unspecified 03/10/2012  . Tdap 08/09/2010   Past Surgical History  Procedure Laterality Date  . Coronary angioplasty with stent placement      Became occluded and resulted in CABG  . Carotid endarterectomy  2004  . Coronary artery bypass graft      x 3   FHx:    Reviewed / unchanged  SHx:    Reviewed / unchanged  Systems Review:  Constitutional: Denies fever, chills, wt changes, headaches, insomnia, fatigue, night sweats, change in appetite. Eyes: Denies redness, blurred vision, diplopia, discharge, itchy, watery eyes.  ENT: Denies discharge, congestion, post nasal drip, epistaxis, sore throat, earache, hearing loss, dental pain, tinnitus, vertigo, sinus pain, snoring.  CV: Denies chest pain, palpitations, irregular heartbeat, syncope, dyspnea, diaphoresis, orthopnea, PND, claudication or edema. Respiratory: denies cough, dyspnea, DOE, pleurisy, hoarseness, laryngitis, wheezing.  Gastrointestinal: Denies dysphagia, odynophagia, heartburn, reflux, water brash, abdominal pain or cramps, nausea, vomiting, bloating, diarrhea, constipation, hematemesis, melena, hematochezia  or hemorrhoids. Genitourinary: Denies dysuria, frequency, urgency, nocturia, hesitancy, discharge, hematuria or flank pain. Musculoskeletal: Denies arthralgias, myalgias, stiffness, jt. swelling, pain, limping or strain/sprain.  Skin: Denies pruritus, rash, hives, warts, acne, eczema or change in skin lesion(s). Neuro: No weakness, tremor, incoordination, spasms,  paresthesia or pain. Psychiatric: Denies confusion, memory loss or sensory loss. Endo: Denies change in weight, skin or hair change.  Heme/Lymph: No excessive bleeding, bruising or enlarged lymph nodes.  Physical Exam  BP 144/82   Pulse 64  Temp 97.3 F   Resp 16  Ht 5\' 8"    Wt 182 lb 12.8 oz   BMI 27.80    Appears well nourished and in no distress. Eyes: PERRLA, EOMs, conjunctiva no swelling or erythema. Sinuses: No frontal/maxillary tenderness ENT/Mouth: EAC's clear, TM's nl w/o erythema, bulging. Nares clear w/o erythema, swelling, exudates. Oropharynx clear without erythema or exudates. Oral hygiene is good. Tongue normal, non obstructing. Hearing intact.  Neck: Supple. Thyroid nl. Car 2+/2+ without bruits, nodes or JVD. Chest: Respirations nl with BS clear & equal w/o rales, rhonchi, wheezing or stridor.  Cor: Heart sounds normal w/ regular rate and rhythm without sig. murmurs, gallops, clicks, or rubs. Peripheral pulses normal and equal  without edema.  Abdomen: Soft & bowel sounds normal. Non-tender w/o guarding, rebound, hernias, masses, or organomegaly.  Lymphatics: Unremarkable.  Musculoskeletal: Full ROM all peripheral extremities, joint stability, 5/5 strength, and normal gait.  Skin: Warm, dry without exposed rashes, lesions or ecchymosis apparent.  Neuro: Cranial nerves intact, reflexes equal bilaterally. Sensory-motor testing grossly intact. Tendon reflexes grossly intact.  Pysch: Alert & oriented x 3.  Insight and judgement nl & appropriate. No ideations.  Assessment and Plan:  1. Essential hypertension  - TSH  2. Hyperlipidemia  - Lipid panel  3. Prediabetes  - Hemoglobin A1c - Insulin, random  4. Vitamin D deficiency  - Vit D  25 hydroxy   5. ASCAD s/p CABG   6. Medication management  - CBC with Differential/Platelet - BASIC METABOLIC PANEL WITH GFR - Hepatic function panel - Magnesium   Recommended regular exercise, BP monitoring, weight control, and discussed med and SE's. Recommended labs to assess and monitor clinical status. Further disposition pending results of labs. Over 30 minutes of exam, counseling, chart review was performed

## 2014-06-13 LAB — INSULIN, RANDOM: Insulin: 6.2 u[IU]/mL (ref 2.0–19.6)

## 2014-06-13 LAB — VITAMIN D 25 HYDROXY (VIT D DEFICIENCY, FRACTURES): Vit D, 25-Hydroxy: 36 ng/mL (ref 30–100)

## 2014-06-13 LAB — TSH: TSH: 5.413 u[IU]/mL — AB (ref 0.350–4.500)

## 2014-07-12 ENCOUNTER — Other Ambulatory Visit: Payer: Self-pay | Admitting: Internal Medicine

## 2014-07-12 DIAGNOSIS — F411 Generalized anxiety disorder: Secondary | ICD-10-CM

## 2014-09-25 ENCOUNTER — Ambulatory Visit: Payer: Self-pay | Admitting: Internal Medicine

## 2014-10-12 ENCOUNTER — Ambulatory Visit: Payer: Self-pay | Admitting: Internal Medicine

## 2014-11-23 ENCOUNTER — Encounter: Payer: Self-pay | Admitting: Internal Medicine

## 2015-07-25 ENCOUNTER — Encounter: Payer: Self-pay | Admitting: Internal Medicine

## 2015-07-25 ENCOUNTER — Ambulatory Visit (INDEPENDENT_AMBULATORY_CARE_PROVIDER_SITE_OTHER): Payer: Self-pay | Admitting: Internal Medicine

## 2015-07-25 VITALS — BP 128/74 | HR 72 | Temp 97.5°F | Resp 16 | Ht 68.0 in | Wt 174.2 lb

## 2015-07-25 DIAGNOSIS — Z125 Encounter for screening for malignant neoplasm of prostate: Secondary | ICD-10-CM | POA: Diagnosis not present

## 2015-07-25 DIAGNOSIS — R7303 Prediabetes: Secondary | ICD-10-CM

## 2015-07-25 DIAGNOSIS — R6889 Other general symptoms and signs: Secondary | ICD-10-CM | POA: Diagnosis not present

## 2015-07-25 DIAGNOSIS — I10 Essential (primary) hypertension: Secondary | ICD-10-CM

## 2015-07-25 DIAGNOSIS — Z79899 Other long term (current) drug therapy: Secondary | ICD-10-CM

## 2015-07-25 DIAGNOSIS — E559 Vitamin D deficiency, unspecified: Secondary | ICD-10-CM

## 2015-07-25 DIAGNOSIS — I2510 Atherosclerotic heart disease of native coronary artery without angina pectoris: Secondary | ICD-10-CM | POA: Diagnosis not present

## 2015-07-25 DIAGNOSIS — Z0001 Encounter for general adult medical examination with abnormal findings: Secondary | ICD-10-CM | POA: Diagnosis not present

## 2015-07-25 DIAGNOSIS — Z1212 Encounter for screening for malignant neoplasm of rectum: Secondary | ICD-10-CM

## 2015-07-25 DIAGNOSIS — N182 Chronic kidney disease, stage 2 (mild): Secondary | ICD-10-CM

## 2015-07-25 DIAGNOSIS — E785 Hyperlipidemia, unspecified: Secondary | ICD-10-CM | POA: Diagnosis not present

## 2015-07-25 DIAGNOSIS — I2583 Coronary atherosclerosis due to lipid rich plaque: Secondary | ICD-10-CM

## 2015-07-25 NOTE — Patient Instructions (Signed)

## 2015-07-28 ENCOUNTER — Encounter: Payer: Self-pay | Admitting: Internal Medicine

## 2015-07-28 NOTE — Progress Notes (Signed)
Patient ID: Andre Warren, male   DOB: 03/26/1945, 70 y.o.   MRN: [id number]  [facility] ADULT & ADOLESCENT INTERNAL MEDICINE   [name], M.D.    Dyanne Carrel. Steffanie Dunn, P.A.-C      Terri Piedra, P.A.-C   [facility]                [address]                [place], [place]. [address] Telephone [phone] Telefax [phone] _________________________________  Annual  Screening/Preventative Visit And Comprehensive Evaluation & Examination     This very nice 70 y.o. MWM presents for a Wellness/Preventative Visit & comprehensive evaluation and management of multiple medical co-morbidities.  Patient has been followed for HTN, ASCAD, Prediabetes, Hyperlipidemia and Vitamin D Deficiency.     HTN predates since 1998 . Patient's BP has been controlled at home.Today's BP: 128/74 mmHg. In 2000,  he had a PTCA and in 2004 he underwent CABG & R CEA.   Patient is fairly physically active and denies any cardiac symptoms as chest pain, palpitations, shortness of breath, dizziness or ankle swelling.     Patient's hyperlipidemia is controlled with diet and medications. Patient denies myalgias or other medication SE's. Last lipids were at goal with T Chol 123, HDL 40, Trig 68 and LDL 69 in Apr 2016.      Patient has prediabetes since Jan 2016 with A1c 5.7% and patient denies reactive hypoglycemic symptoms, visual blurring, diabetic polys or paresthesias. Last A1c was 5.8% in Apr 2016.      Finally, patient has history of Vitamin D Deficiency of "31" in 2008  and last vitamin D was very low at 74 in Apr 2016.   Medication Sig  . atorvastatin 80 MG Take 40 mg by mouth daily.  Marland Kitchen BABY ASPIRIN  Take 81 mg by mouth daily.  Marland Kitchen VITAMIN D Take 8,000 Int'l Units by mouth daily.  . Isosorbide 30 MG  Take by mouth daily. Patient taking 1 1/2 tabs daily=45 mg.  . losartan  50 MG Take 25 mg by mouth daily. One half tablet daily  . metoprolol  50 MGt Take 0.5 tablets (25 mg  total) by mouth 2 (two) times daily. Take one half tablet   Allergies  Allergen Reactions  . Latex Itching   Past Medical History  Diagnosis Date  . CAD (coronary artery disease)   . Hypercholesteremia   . Hypertension   . Subdural hematoma, post-traumatic (HCC) 03/2012    Saw Dr. Danielle Dess has been released.   . Peripheral arterial disease (HCC)     LCEA   Health Maintenance  Topic Date Due  . Hepatitis C Screening  [birth year]  . ZOSTAVAX  04/20/2006  . PNA vac Low Risk Adult (2 of 2 - PCV13) 03/10/2013  . INFLUENZA VACCINE  10/09/2015  . COLONOSCOPY  10/25/2017  . TETANUS/TDAP  08/08/2020   Immunization History  Administered Date(s) Administered  . Pneumococcal Polysaccharide-23 04/20/1995  . Pneumococcal-Unspecified 03/10/2012  . Tdap 08/09/2010   Past Surgical History  Procedure Laterality Date  . Coronary angioplasty with stent placement      Became occluded and resulted in CABG  . Carotid endarterectomy  2004  . Coronary artery bypass graft      x 3   Family History  Problem Relation Age of Onset  . Hypertension Mother   . Heart disease Mother   . Hypertension Father   . Stroke Father   .  Heart disease Father   . Cancer Father   . Cancer Sister   . Heart disease Sister     Social History   Social History  . Marital Status: Married    Spouse Name: N/A  . Number of Children: N/A  . 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 Smoking status: Never Smoker   . Smokeless tobacco: Not on file  . Alcohol Use: No  . Drug Use: No  . Sexual Activity: Yes    Birth Control/ Protection: None     ROS Constitutional: Denies fever, chills, weight loss/gain, headaches, insomnia,  night sweats or change in appetite. Does c/o fatigue. Eyes: Denies redness, blurred vision, diplopia, discharge, itchy or watery eyes.  ENT: Denies discharge, congestion, post nasal drip,  epistaxis, sore throat, earache, hearing loss, dental pain, Tinnitus, Vertigo, Sinus pain or snoring.  Cardio: Denies chest pain, palpitations, irregular heartbeat, syncope, dyspnea, diaphoresis, orthopnea, PND, claudication or edema Respiratory: denies cough, dyspnea, DOE, pleurisy, hoarseness, laryngitis or wheezing.  Gastrointestinal: Denies dysphagia, heartburn, reflux, water brash, pain, cramps, nausea, vomiting, bloating, diarrhea, constipation, hematemesis, melena, hematochezia, jaundice or hemorrhoids Genitourinary: Denies dysuria, frequency, urgency, nocturia, hesitancy, discharge, hematuria or flank pain Musculoskeletal: Denies arthralgia, myalgia, stiffness, Jt. Swelling, pain, limp or strain/sprain. Denies Falls. Skin: Denies puritis, rash, hives, warts, acne, eczema or change in skin lesion Neuro: No weakness, tremor, incoordination, spasms, paresthesia or pain Psychiatric: Denies confusion, memory loss or sensory loss. Denies Depression. Endocrine: Denies change in weight, skin, hair change, nocturia, and paresthesia, diabetic polys, visual blurring or hyper / hypo glycemic episodes.  Heme/Lymph: No excessive bleeding, bruising or enlarged lymph nodes.  Physical Exam  BP 128/74 mmHg  Pulse 72  Temp(Src) 97.5 F (36.4 C)  Resp 16  Ht 5\' 8"  (1.727 m)  Wt 174 lb 3.2 oz (79.017 kg)  BMI 26.49 kg/m2  General Appearance: Well nourished, in no apparent distress.  Eyes: PERRLA, EOMs, conjunctiva no swelling or erythema, normal fundi and vessels. Sinuses: No frontal/maxillary tenderness ENT/Mouth: EACs patent / TMs  nl. Nares clear without erythema, swelling, mucoid exudates. Oral hygiene is good. No erythema, swelling, or exudate. Tongue normal, non-obstructing. Tonsils not swollen or erythematous. Hearing normal.  Neck: Supple, thyroid normal. No bruits, nodes or JVD. Respiratory: Respiratory effort normal.  BS equal and clear bilateral without rales, rhonci, wheezing or  stridor. Cardio: Heart sounds are normal with regular rate and rhythm and no murmurs, rubs or gallops. Peripheral pulses are normal and equal bilaterally without edema. No aortic or femoral bruits. Chest: symmetric with normal excursions and percussion.  Abdomen: Soft, with Nl bowel sounds. Nontender, no guarding, rebound, hernias, masses, or organomegaly.  Lymphatics: Non tender without lymphadenopathy.  Genitourinary: No hernias.Testes nl. DRE - prostate nl for age - smooth & firm w/o nodules. Musculoskeletal: Full ROM all peripheral extremities, joint stability, 5/5 strength, and normal gait. Skin: Warm and dry without rashes, lesions, cyanosis, clubbing or  ecchymosis.  Neuro: Cranial nerves intact, reflexes equal bilaterally. Normal muscle tone, no cerebellar symptoms. Sensation intact.  Pysch: Alert and oriented X 3 with normal affect, insight and judgment appropriate.   Assessment and Plan  1. Annual Preventative/Screening Exam   2. Essential hypertension   3. Hyperlipidemia   4. Prediabetes   5. Vitamin D deficiency   6. Coronary artery disease due to lipid rich plaque   7. CKD (chronic  kidney disease) stage 2, GFR 60-89 ml/min   8. Screening for rectal cancer   9. Prostate cancer screening   10. Medication management   Continue prudent diet as discussed, weight control, BP monitoring, regular exercise, and medications as discussed.  Discussed med effects and SE's. Routine screening labs were deferred by patient as he intends to have done at the the [place] [facility]. with regular follow-up as recommended. Over 40 minutes of exam, counseling, chart review and high complex critical decision making was performed

## 2015-10-25 ENCOUNTER — Ambulatory Visit: Payer: Self-pay | Admitting: Internal Medicine

## 2015-12-18 ENCOUNTER — Encounter: Payer: Self-pay | Admitting: Internal Medicine

## 2016-01-30 ENCOUNTER — Ambulatory Visit: Payer: Self-pay | Admitting: Physician Assistant

## 2016-01-30 ENCOUNTER — Ambulatory Visit: Payer: Self-pay | Admitting: Internal Medicine

## 2016-08-12 ENCOUNTER — Encounter (HOSPITAL_COMMUNITY): Payer: Self-pay

## 2016-08-12 ENCOUNTER — Emergency Department (HOSPITAL_COMMUNITY)
Admission: EM | Admit: 2016-08-12 | Discharge: 2016-08-12 | Disposition: A | Discharge status: Home / Self Care | Payer: Medicare Other | Attending: Emergency Medicine | Admitting: Emergency Medicine

## 2016-08-12 DIAGNOSIS — I129 Hypertensive chronic kidney disease with stage 1 through stage 4 chronic kidney disease, or unspecified chronic kidney disease: Secondary | ICD-10-CM | POA: Diagnosis not present

## 2016-08-12 DIAGNOSIS — I2510 Atherosclerotic heart disease of native coronary artery without angina pectoris: Secondary | ICD-10-CM | POA: Insufficient documentation

## 2016-08-12 DIAGNOSIS — M79604 Pain in right leg: Secondary | ICD-10-CM | POA: Insufficient documentation

## 2016-08-12 DIAGNOSIS — M79605 Pain in left leg: Secondary | ICD-10-CM | POA: Diagnosis not present

## 2016-08-12 DIAGNOSIS — R5383 Other fatigue: Secondary | ICD-10-CM

## 2016-08-12 DIAGNOSIS — Z79899 Other long term (current) drug therapy: Secondary | ICD-10-CM | POA: Insufficient documentation

## 2016-08-12 DIAGNOSIS — M791 Myalgia, unspecified site: Secondary | ICD-10-CM

## 2016-08-12 DIAGNOSIS — N182 Chronic kidney disease, stage 2 (mild): Secondary | ICD-10-CM | POA: Insufficient documentation

## 2016-08-12 LAB — URINALYSIS, ROUTINE W REFLEX MICROSCOPIC
Bilirubin Urine: NEGATIVE
GLUCOSE, UA: 150 mg/dL — AB
Ketones, ur: NEGATIVE mg/dL
LEUKOCYTES UA: NEGATIVE
NITRITE: NEGATIVE
PH: 6 (ref 5.0–8.0)
Protein, ur: NEGATIVE mg/dL
SPECIFIC GRAVITY, URINE: 1.012 (ref 1.005–1.030)
SQUAMOUS EPITHELIAL / LPF: NONE SEEN

## 2016-08-12 LAB — COMPREHENSIVE METABOLIC PANEL
ALT: 20 U/L (ref 17–63)
AST: 31 U/L (ref 15–41)
Albumin: 4.4 g/dL (ref 3.5–5.0)
Alkaline Phosphatase: 100 U/L (ref 38–126)
Anion gap: 9 (ref 5–15)
BUN: 17 mg/dL (ref 6–20)
CO2: 23 mmol/L (ref 22–32)
Calcium: 9.7 mg/dL (ref 8.9–10.3)
Chloride: 100 mmol/L — ABNORMAL LOW (ref 101–111)
Creatinine, Ser: 1.3 mg/dL — ABNORMAL HIGH (ref 0.61–1.24)
GFR calc Af Amer: >60 mL/min (ref >60)
GFR, EST NON AFRICAN AMERICAN: 54 mL/min — AB (ref >60)
Glucose, Bld: 121 mg/dL — ABNORMAL HIGH (ref 65–99)
Potassium: 3.8 mmol/L (ref 3.5–5.1)
Sodium: 132 mmol/L — ABNORMAL LOW (ref 135–145)
Total Bilirubin: 2.3 mg/dL — ABNORMAL HIGH (ref 0.3–1.2)
Total Protein: 7.4 g/dL (ref 6.5–8.1)

## 2016-08-12 LAB — CK: CK TOTAL: 88 U/L (ref 49–397)

## 2016-08-12 LAB — CBC
HCT: 41.2 % (ref 39.0–52.0)
Hemoglobin: 14.6 g/dL (ref 13.0–17.0)
MCH: 32.7 pg (ref 26.0–34.0)
MCHC: 35.4 g/dL (ref 30.0–36.0)
MCV: 92.4 fL (ref 78.0–100.0)
PLATELETS: 112 10*3/uL — AB (ref 150–400)
RBC: 4.46 MIL/uL (ref 4.22–5.81)
RDW: 14.3 % (ref 11.5–15.5)
WBC: 3.8 10*3/uL — ABNORMAL LOW (ref 4.0–10.5)

## 2016-08-12 MED ORDER — LOSARTAN POTASSIUM 25 MG PO TABS
25.0000 mg | ORAL_TABLET | Freq: Once | ORAL | Status: AC
  Administered 2016-08-12: 25 mg via ORAL
  Filled 2016-08-12: qty 1

## 2016-08-12 MED ORDER — SODIUM CHLORIDE 0.9 % IV SOLN
INTRAVENOUS | Status: DC
  Administered 2016-08-12: 09:00:00 via INTRAVENOUS

## 2016-08-12 MED ORDER — IBUPROFEN 200 MG PO TABS
400.0000 mg | ORAL_TABLET | Freq: Once | ORAL | Status: AC
  Administered 2016-08-12: 400 mg via ORAL
  Filled 2016-08-12: qty 2

## 2016-08-12 NOTE — ED Triage Notes (Signed)
Patient c/o body aches and fatigue x 3 days.

## 2016-08-12 NOTE — Discharge Instructions (Signed)
Follow-up with your primary care doctor. Take Tylenol as needed for pain. Monitor fever, chills or other worsening symptoms.

## 2016-08-12 NOTE — ED Provider Notes (Signed)
[facility]-EMERGENCY DEPT Provider Note   CSN: [id number] Arrival date & time: 08/12/16  0715     History   Chief Complaint Chief Complaint  Patient presents with  . Generalized Body Aches  . Fatigue    HPI Andre Warren is a 71 y.o. male.  HPI Pt states he has been having trouble with a lot of achiness the last few days.  He feels it mostly in the back of both of his legs.  He took some ibuprofen the other day and it helped.  Yesterday it started up again.  It affected him while he was trying to work.  Last night it felt worse again.  He tried drinking water in case he was dehydrated but it did not help.  He also has a headache.  His nose feels dry.  He denies any fever.  No new medications.  No tick bites that he aware of.  No vomiting or diarrhea.  No trouble with breathing.  Recently started a new cpap machine the end of last week.  He has not been sleeping well with it. Past Medical History:  Diagnosis Date  . CAD (coronary artery disease)   . Hypercholesteremia   . Hypertension   . Peripheral arterial disease (HCC)    LCEA  . Subdural hematoma, post-traumatic (HCC) 03/2012   Saw Dr. Danielle Dess has been released.     Patient Active Problem List   Diagnosis Date Noted  . H/O carotid artery stenosis 03/21/2014  . CKD (chronic kidney disease) stage 2, GFR 60-89 ml/min 03/21/2014  . Prediabetes 05/08/2013  . Vitamin D deficiency 05/08/2013  . Medication management 05/08/2013  . Hypertension   . CAD (coronary artery disease) 03/15/2012  . Hyperlipidemia 03/15/2012    Past Surgical History:  Procedure Laterality Date  . CAROTID ENDARTERECTOMY  2004  . CORONARY ANGIOPLASTY WITH STENT PLACEMENT     Became occluded and resulted in CABG  . CORONARY ARTERY BYPASS GRAFT     x 3       Home Medications    Prior to Admission medications   Medication Sig Start Date End Date Taking? Authorizing Provider  atorvastatin (LIPITOR) 80 MG tablet Take 40 mg by mouth daily.   Yes  [provider]  BABY ASPIRIN PO Take 81 mg by mouth daily.   Yes [provider]  isosorbide dinitrate (ISORDIL) 20 MG tablet Take 20 mg by mouth 3 (three) times daily as needed. IF BLOOD PRESSURE IS OVER 110 ONLY   Yes [provider]  losartan (COZAAR) 50 MG tablet Take 25 mg by mouth daily. One half tablet daily   Yes [provider]  metoprolol (LOPRESSOR) 50 MG tablet Take 0.5 tablets (25 mg total) by mouth 2 (two) times daily. Take one half tablet 03/17/12  Yes Rhetta Mura, MD    Family History Family History  Problem Relation Age of Onset  . Hypertension Mother   . Heart disease Mother   . Hypertension Father   . Stroke Father   . Heart disease Father   . Cancer Father   . Cancer Sister   . Heart disease Sister     Social History Social History  Substance Use Topics  . Smoking status: Never Smoker  . Smokeless tobacco: Never Used  . Alcohol use No     Allergies   Latex   Review of Systems Review of Systems  All other systems reviewed and are negative.    Physical Exam Updated Vital Signs  BP (!) 147/83 (BP Location: Left Arm)   Pulse 78   Temp 98.6 F (37 C)   Resp (!) 24   Ht 1.727 m (5\' 8" )   Wt 77.1 kg (170 lb)   SpO2 100%   BMI 25.85 kg/m   Physical Exam  Constitutional: He appears well-developed and well-nourished. No distress.  HENT:  Head: Normocephalic and atraumatic.  Right Ear: External ear normal.  Left Ear: External ear normal.  Eyes: Conjunctivae are normal. Right eye exhibits no discharge. Left eye exhibits no discharge. No scleral icterus.  Neck: Neck supple. No tracheal deviation present.  Cardiovascular: Normal rate, regular rhythm and intact distal pulses.   Pulmonary/Chest: Effort normal and breath sounds normal. No stridor. No respiratory distress. He has no wheezes. He has no rales.  Abdominal: Soft. Bowel sounds are normal. He exhibits no distension. There is no tenderness. There is  no rebound and no guarding.  Musculoskeletal: He exhibits no edema or tenderness.  Neurological: He is alert. He has normal strength. No cranial nerve deficit (no facial droop, extraocular movements intact, no slurred speech) or sensory deficit. He exhibits normal muscle tone. He displays no seizure activity. Coordination normal.  Skin: Skin is warm and dry. No rash noted.  Psychiatric: He has a normal mood and affect.  Nursing note and vitals reviewed.    ED Treatments / Results  Labs (all labs ordered are listed, but only abnormal results are displayed) Labs Reviewed  CBC - Abnormal; Notable for the following:       Result Value   WBC 3.8 (*)    Platelets 112 (*)    All other components within normal limits  COMPREHENSIVE METABOLIC PANEL - Abnormal; Notable for the following:    Sodium 132 (*)    Chloride 100 (*)    Glucose, Bld 121 (*)    Creatinine, Ser 1.30 (*)    Total Bilirubin 2.3 (*)    GFR calc non Af Amer 54 (*)    All other components within normal limits  URINALYSIS, ROUTINE W REFLEX MICROSCOPIC - Abnormal; Notable for the following:    Glucose, UA 150 (*)    Hgb urine dipstick SMALL (*)    Bacteria, UA MANY (*)    All other components within normal limits  URINE CULTURE  CK    EKG  EKG Interpretation  Date/Time:  Tuesday August 12 2016 08:15:49 EDT Ventricular Rate:  85 PR Interval:    QRS Duration: 83 QT Interval:  351 QTC Calculation: 418 R Axis:   0 Text Interpretation:  Sinus rhythm No old tracing to compare Confirmed by Linwood Dibbles [phone]) on 08/12/2016 8:29:26 AM Also confirmed by Linwood Dibbles [phone]), editor Misty Stanley [phone])  on 08/12/2016 9:22:29 AM       Radiology No results found.  Procedures Procedures (including critical care time)  Medications Ordered in ED Medications  0.9 %  sodium chloride infusion ( Intravenous New Bag/Given 08/12/16 0835)  ibuprofen (ADVIL,MOTRIN) tablet 400 mg (400 mg Oral Given 08/12/16 0834)  losartan  (COZAAR) tablet 25 mg (25 mg Oral Given 08/12/16 0834)     Initial Impression / Assessment and Plan / ED Course  I have reviewed the triage vital signs and the nursing notes.  Pertinent labs & imaging results that were available during my care of the patient were reviewed by me and considered in my medical decision making (see chart for details).   patient presented to the emergency room with complaints of myalgias and  fatigue. Patient denies any fevers. He denies any medication changes recently. He is on HMG Co A meds but this is not new for him.  Leg exam is unremarkable.  No signs of infection or abnormal perfusion.   UA shows bacturia but he denies urinary symptoms.  Will send off culture.    Mild increase in creatinine.  He feels better after fluids.  It is possible mild dehydration was contributing to muscle cramping.  Discussed otc pain meds, tylenol.  Follow up with PCP.  Monitor for fever, worsening symptoms  Final Clinical Impressions(s) / ED Diagnoses   Final diagnoses:  None    New Prescriptions New Prescriptions   No medications on file     Linwood Dibbles, MD 08/12/16 1016

## 2016-08-13 LAB — URINE CULTURE: Culture: NO GROWTH

## 2016-08-29 ENCOUNTER — Encounter: Payer: Self-pay | Admitting: Internal Medicine

## 2017-08-31 ENCOUNTER — Encounter: Payer: Self-pay | Admitting: Gastroenterology

## 2017-09-03 ENCOUNTER — Inpatient Hospital Stay (HOSPITAL_COMMUNITY)
Admission: AD | Admit: 2017-09-03 | Discharge: 2017-09-06 | DRG: 281 | Disposition: A | Discharge status: Home / Self Care | Payer: No Typology Code available for payment source | Attending: Cardiology | Admitting: Cardiology

## 2017-09-03 ENCOUNTER — Encounter (HOSPITAL_COMMUNITY): Payer: Self-pay | Admitting: Cardiology

## 2017-09-03 ENCOUNTER — Inpatient Hospital Stay (HOSPITAL_COMMUNITY)
Admission: AD | Disposition: A | Discharge status: Home / Self Care | Payer: Self-pay | Source: Home / Self Care | Attending: Cardiology

## 2017-09-03 DIAGNOSIS — Z8249 Family history of ischemic heart disease and other diseases of the circulatory system: Secondary | ICD-10-CM | POA: Diagnosis not present

## 2017-09-03 DIAGNOSIS — Z91040 Latex allergy status: Secondary | ICD-10-CM

## 2017-09-03 DIAGNOSIS — N183 Chronic kidney disease, stage 3 (moderate): Secondary | ICD-10-CM | POA: Diagnosis present

## 2017-09-03 DIAGNOSIS — E7800 Pure hypercholesterolemia, unspecified: Secondary | ICD-10-CM | POA: Diagnosis present

## 2017-09-03 DIAGNOSIS — I480 Paroxysmal atrial fibrillation: Secondary | ICD-10-CM | POA: Diagnosis present

## 2017-09-03 DIAGNOSIS — Z79899 Other long term (current) drug therapy: Secondary | ICD-10-CM

## 2017-09-03 DIAGNOSIS — I2510 Atherosclerotic heart disease of native coronary artery without angina pectoris: Secondary | ICD-10-CM | POA: Diagnosis present

## 2017-09-03 DIAGNOSIS — I701 Atherosclerosis of renal artery: Secondary | ICD-10-CM | POA: Diagnosis present

## 2017-09-03 DIAGNOSIS — I2119 ST elevation (STEMI) myocardial infarction involving other coronary artery of inferior wall: Secondary | ICD-10-CM | POA: Diagnosis present

## 2017-09-03 DIAGNOSIS — Z823 Family history of stroke: Secondary | ICD-10-CM | POA: Diagnosis not present

## 2017-09-03 DIAGNOSIS — Q250 Patent ductus arteriosus: Secondary | ICD-10-CM

## 2017-09-03 DIAGNOSIS — I739 Peripheral vascular disease, unspecified: Secondary | ICD-10-CM | POA: Diagnosis present

## 2017-09-03 DIAGNOSIS — R079 Chest pain, unspecified: Secondary | ICD-10-CM | POA: Diagnosis present

## 2017-09-03 DIAGNOSIS — I10 Essential (primary) hypertension: Secondary | ICD-10-CM | POA: Diagnosis present

## 2017-09-03 DIAGNOSIS — Z951 Presence of aortocoronary bypass graft: Secondary | ICD-10-CM | POA: Diagnosis not present

## 2017-09-03 DIAGNOSIS — I129 Hypertensive chronic kidney disease with stage 1 through stage 4 chronic kidney disease, or unspecified chronic kidney disease: Secondary | ICD-10-CM | POA: Diagnosis present

## 2017-09-03 DIAGNOSIS — E785 Hyperlipidemia, unspecified: Secondary | ICD-10-CM | POA: Diagnosis present

## 2017-09-03 DIAGNOSIS — I219 Acute myocardial infarction, unspecified: Secondary | ICD-10-CM

## 2017-09-03 DIAGNOSIS — Z955 Presence of coronary angioplasty implant and graft: Secondary | ICD-10-CM

## 2017-09-03 DIAGNOSIS — Z7901 Long term (current) use of anticoagulants: Secondary | ICD-10-CM | POA: Diagnosis not present

## 2017-09-03 HISTORY — DX: Chronic kidney disease, stage 2 (mild): N18.2

## 2017-09-03 HISTORY — DX: Contact with and (suspected) exposure to other hazardous, chiefly nonmedicinal, chemicals: Z77.098

## 2017-09-03 HISTORY — DX: Contact with and (suspected) exposure to other war theater: Z77.39

## 2017-09-03 HISTORY — PX: LEFT HEART CATH AND CORS/GRAFTS ANGIOGRAPHY: CATH118250

## 2017-09-03 LAB — CBC
HCT: 41.1 % (ref 39.0–52.0)
HEMOGLOBIN: 14 g/dL (ref 13.0–17.0)
MCH: 32.6 pg (ref 26.0–34.0)
MCHC: 34.1 g/dL (ref 30.0–36.0)
MCV: 95.6 fL (ref 78.0–100.0)
Platelets: 170 10*3/uL (ref 150–400)
RBC: 4.3 MIL/uL (ref 4.22–5.81)
RDW: 13.4 % (ref 11.5–15.5)
WBC: 11 10*3/uL — AB (ref 4.0–10.5)

## 2017-09-03 LAB — PROTIME-INR
INR: 1.12
PROTHROMBIN TIME: 14.4 s (ref 11.4–15.2)

## 2017-09-03 LAB — LIPID PANEL
CHOLESTEROL: 140 mg/dL (ref 0–200)
HDL: 39 mg/dL — ABNORMAL LOW (ref >40)
LDL Cholesterol: 86 mg/dL (ref 0–99)
TRIGLYCERIDES: 73 mg/dL (ref <150)
Total CHOL/HDL Ratio: 3.6 RATIO
VLDL: 15 mg/dL (ref 0–40)

## 2017-09-03 LAB — COMPREHENSIVE METABOLIC PANEL
ALK PHOS: 76 U/L (ref 38–126)
ALT: 13 U/L (ref 0–44)
AST: 21 U/L (ref 15–41)
Albumin: 4.4 g/dL (ref 3.5–5.0)
Anion gap: 12 (ref 5–15)
BILIRUBIN TOTAL: 2.5 mg/dL — AB (ref 0.3–1.2)
BUN: 16 mg/dL (ref 8–23)
CALCIUM: 10 mg/dL (ref 8.9–10.3)
CO2: 17 mmol/L — ABNORMAL LOW (ref 22–32)
CREATININE: 1.5 mg/dL — AB (ref 0.61–1.24)
Chloride: 107 mmol/L (ref 98–111)
GFR, EST AFRICAN AMERICAN: 52 mL/min — AB (ref >60)
GFR, EST NON AFRICAN AMERICAN: 45 mL/min — AB (ref >60)
Glucose, Bld: 122 mg/dL — ABNORMAL HIGH (ref 70–99)
Potassium: 4.2 mmol/L (ref 3.5–5.1)
Sodium: 136 mmol/L (ref 135–145)
TOTAL PROTEIN: 7.2 g/dL (ref 6.5–8.1)

## 2017-09-03 LAB — POCT ACTIVATED CLOTTING TIME
ACTIVATED CLOTTING TIME: 147 s
ACTIVATED CLOTTING TIME: 197 s
Activated Clotting Time: 274 seconds

## 2017-09-03 LAB — TROPONIN I
TROPONIN I: 0.08 ng/mL — AB (ref <0.03)
TROPONIN I: 0.31 ng/mL — AB (ref <0.03)

## 2017-09-03 LAB — POCT I-STAT, CHEM 8
BUN: 18 mg/dL (ref 8–23)
Calcium, Ion: 1.32 mmol/L (ref 1.15–1.40)
Chloride: 106 mmol/L (ref 98–111)
Creatinine, Ser: 1.3 mg/dL — ABNORMAL HIGH (ref 0.61–1.24)
Glucose, Bld: 129 mg/dL — ABNORMAL HIGH (ref 70–99)
HEMATOCRIT: 40 % (ref 39.0–52.0)
HEMOGLOBIN: 13.6 g/dL (ref 13.0–17.0)
POTASSIUM: 4.2 mmol/L (ref 3.5–5.1)
SODIUM: 138 mmol/L (ref 135–145)
TCO2: 21 mmol/L — AB (ref 22–32)

## 2017-09-03 LAB — HEMOGLOBIN A1C
Hgb A1c MFr Bld: 5.4 % (ref 4.8–5.6)
Mean Plasma Glucose: 108.28 mg/dL

## 2017-09-03 LAB — MRSA PCR SCREENING: MRSA by PCR: NEGATIVE

## 2017-09-03 LAB — APTT: APTT: 31 s (ref 24–36)

## 2017-09-03 SURGERY — LEFT HEART CATH AND CORS/GRAFTS ANGIOGRAPHY
Anesthesia: LOCAL

## 2017-09-03 MED ORDER — BIVALIRUDIN BOLUS VIA INFUSION - CUPID
INTRAVENOUS | Status: DC | PRN
  Administered 2017-09-03: 57.825 mg via INTRAVENOUS

## 2017-09-03 MED ORDER — ATORVASTATIN CALCIUM 80 MG PO TABS
80.0000 mg | ORAL_TABLET | Freq: Every day | ORAL | Status: DC
  Administered 2017-09-03 – 2017-09-06 (×4): 80 mg via ORAL
  Filled 2017-09-03 (×4): qty 1

## 2017-09-03 MED ORDER — LOSARTAN POTASSIUM 25 MG PO TABS
25.0000 mg | ORAL_TABLET | Freq: Every day | ORAL | Status: DC
  Administered 2017-09-04: 25 mg via ORAL
  Filled 2017-09-03: qty 1

## 2017-09-03 MED ORDER — MORPHINE SULFATE (PF) 10 MG/ML IV SOLN
INTRAVENOUS | Status: AC
  Filled 2017-09-03: qty 1

## 2017-09-03 MED ORDER — FENTANYL CITRATE (PF) 100 MCG/2ML IJ SOLN
INTRAMUSCULAR | Status: DC | PRN
  Administered 2017-09-03 (×2): 50 ug via INTRAVENOUS

## 2017-09-03 MED ORDER — SODIUM CHLORIDE 0.9 % IV SOLN
INTRAVENOUS | Status: DC | PRN
  Administered 2017-09-03: 1.75 mg/kg/h via INTRAVENOUS

## 2017-09-03 MED ORDER — MIDAZOLAM HCL 2 MG/2ML IJ SOLN
INTRAMUSCULAR | Status: DC | PRN
  Administered 2017-09-03: 1 mg via INTRAVENOUS

## 2017-09-03 MED ORDER — NITROGLYCERIN IN D5W 200-5 MCG/ML-% IV SOLN
INTRAVENOUS | Status: AC
  Filled 2017-09-03: qty 250

## 2017-09-03 MED ORDER — ATROPINE SULFATE 1 MG/10ML IJ SOSY
PREFILLED_SYRINGE | INTRAMUSCULAR | Status: AC
  Filled 2017-09-03: qty 10

## 2017-09-03 MED ORDER — SODIUM CHLORIDE 0.9% FLUSH: 3.0000 mL | INTRAVENOUS | Status: DC | PRN

## 2017-09-03 MED ORDER — LIDOCAINE HCL (PF) 1 % IJ SOLN
INTRAMUSCULAR | Status: DC | PRN
  Administered 2017-09-03: 16 mL

## 2017-09-03 MED ORDER — ONDANSETRON HCL 4 MG/2ML IJ SOLN: 4.0000 mg | Freq: Four times a day (QID) | INTRAMUSCULAR | Status: DC | PRN

## 2017-09-03 MED ORDER — TICAGRELOR 90 MG PO TABS
ORAL_TABLET | ORAL | Status: AC
  Filled 2017-09-03: qty 2

## 2017-09-03 MED ORDER — METOPROLOL TARTRATE 50 MG PO TABS
25.0000 mg | ORAL_TABLET | Freq: Two times a day (BID) | ORAL | Status: DC
  Administered 2017-09-04 (×2): 25 mg via ORAL
  Filled 2017-09-03 (×2): qty 1

## 2017-09-03 MED ORDER — HYDRALAZINE HCL 20 MG/ML IJ SOLN: 5.0000 mg | INTRAMUSCULAR | Status: AC | PRN

## 2017-09-03 MED ORDER — HYDRALAZINE HCL 20 MG/ML IJ SOLN
INTRAMUSCULAR | Status: DC | PRN
  Administered 2017-09-03: 20 mg via INTRAVENOUS

## 2017-09-03 MED ORDER — MIDAZOLAM HCL 2 MG/2ML IJ SOLN
INTRAMUSCULAR | Status: AC
  Filled 2017-09-03: qty 2

## 2017-09-03 MED ORDER — OXYCODONE HCL 5 MG PO TABS: 5.0000 mg | ORAL_TABLET | ORAL | Status: DC | PRN

## 2017-09-03 MED ORDER — SODIUM CHLORIDE 0.9 % IV SOLN: 250.0000 mL | INTRAVENOUS | Status: DC | PRN

## 2017-09-03 MED ORDER — FENTANYL CITRATE (PF) 100 MCG/2ML IJ SOLN
INTRAMUSCULAR | Status: AC
  Filled 2017-09-03: qty 2

## 2017-09-03 MED ORDER — LOSARTAN POTASSIUM 25 MG PO TABS: 25.0000 mg | ORAL_TABLET | Freq: Every day | ORAL | Status: DC

## 2017-09-03 MED ORDER — NITROGLYCERIN IN D5W 200-5 MCG/ML-% IV SOLN
0.0000 ug/min | INTRAVENOUS | Status: DC
  Administered 2017-09-03: 22 ug/min via INTRAVENOUS

## 2017-09-03 MED ORDER — LABETALOL HCL 5 MG/ML IV SOLN: 10.0000 mg | INTRAVENOUS | Status: AC | PRN

## 2017-09-03 MED ORDER — TICAGRELOR 90 MG PO TABS
ORAL_TABLET | ORAL | Status: DC | PRN
  Administered 2017-09-03: 180 mg via ORAL

## 2017-09-03 MED ORDER — HEPARIN (PORCINE) IN NACL 1000-0.9 UT/500ML-% IV SOLN
INTRAVENOUS | Status: AC
  Filled 2017-09-03: qty 1000

## 2017-09-03 MED ORDER — HEPARIN (PORCINE) IN NACL 100-0.45 UNIT/ML-% IJ SOLN
950.0000 [IU]/h | INTRAMUSCULAR | Status: DC
  Administered 2017-09-04: 850 [IU]/h via INTRAVENOUS
  Filled 2017-09-03: qty 250

## 2017-09-03 MED ORDER — TICAGRELOR 90 MG PO TABS
90.0000 mg | ORAL_TABLET | Freq: Two times a day (BID) | ORAL | Status: DC
  Administered 2017-09-04 (×2): 90 mg via ORAL
  Filled 2017-09-03 (×2): qty 1

## 2017-09-03 MED ORDER — HYDRALAZINE HCL 20 MG/ML IJ SOLN
INTRAMUSCULAR | Status: AC
  Filled 2017-09-03: qty 1

## 2017-09-03 MED ORDER — ASPIRIN 81 MG PO CHEW
81.0000 mg | CHEWABLE_TABLET | Freq: Every day | ORAL | Status: DC
  Administered 2017-09-04 – 2017-09-06 (×3): 81 mg via ORAL
  Filled 2017-09-03 (×3): qty 1

## 2017-09-03 MED ORDER — HEPARIN (PORCINE) IN NACL 2-0.9 UNITS/ML
INTRAMUSCULAR | Status: AC | PRN
  Administered 2017-09-03 (×2): 500 mL via INTRA_ARTERIAL

## 2017-09-03 MED ORDER — LIDOCAINE HCL (PF) 1 % IJ SOLN
INTRAMUSCULAR | Status: AC
  Filled 2017-09-03: qty 30

## 2017-09-03 MED ORDER — SODIUM CHLORIDE 0.9% FLUSH
3.0000 mL | Freq: Two times a day (BID) | INTRAVENOUS | Status: DC
  Administered 2017-09-04 – 2017-09-05 (×4): 3 mL via INTRAVENOUS

## 2017-09-03 MED ORDER — NITROGLYCERIN IN D5W 200-5 MCG/ML-% IV SOLN
INTRAVENOUS | Status: AC | PRN
  Administered 2017-09-03: 15 ug/min via INTRAVENOUS

## 2017-09-03 MED ORDER — SODIUM CHLORIDE 0.9 % IV SOLN
INTRAVENOUS | Status: AC
  Administered 2017-09-03: 19:00:00 via INTRAVENOUS

## 2017-09-03 MED ORDER — BIVALIRUDIN TRIFLUOROACETATE 250 MG IV SOLR
INTRAVENOUS | Status: AC
  Filled 2017-09-03: qty 250

## 2017-09-03 MED ORDER — ACETAMINOPHEN 325 MG PO TABS: 650.0000 mg | ORAL_TABLET | ORAL | Status: DC | PRN

## 2017-09-03 MED ORDER — IOPAMIDOL (ISOVUE-370) INJECTION 76%
INTRAVENOUS | Status: DC | PRN
  Administered 2017-09-03: 105 mL via INTRA_ARTERIAL

## 2017-09-03 MED ORDER — MORPHINE SULFATE (PF) 10 MG/ML IV SOLN
INTRAVENOUS | Status: DC | PRN
  Administered 2017-09-03: 2 mg via INTRAVENOUS

## 2017-09-03 MED ORDER — IOPAMIDOL (ISOVUE-370) INJECTION 76%
INTRAVENOUS | Status: AC
  Filled 2017-09-03: qty 125

## 2017-09-03 MED ORDER — SODIUM CHLORIDE 0.9 % IV SOLN
INTRAVENOUS | Status: AC | PRN
  Administered 2017-09-03: 150 mL/h via INTRAVENOUS

## 2017-09-03 MED ORDER — MORPHINE SULFATE (PF) 2 MG/ML IV SOLN
2.0000 mg | INTRAVENOUS | Status: DC | PRN
  Administered 2017-09-03: 2 mg via INTRAVENOUS
  Filled 2017-09-03: qty 1

## 2017-09-03 SURGICAL SUPPLY — 14 items
CATH INFINITI 5 FR RCB (CATHETERS) ×2 IMPLANT
CATH INFINITI 5FR MULTPACK ANG (CATHETERS) ×2 IMPLANT
ELECT DEFIB PAD ADLT CADENCE (PAD) ×2 IMPLANT
GLIDESHEATH SLEND SS 6F .021 (SHEATH) ×2 IMPLANT
GUIDEWIRE INQWIRE 1.5J.035X260 (WIRE) ×1 IMPLANT
INQWIRE 1.5J .035X260CM (WIRE) ×2
KIT ENCORE 26 ADVANTAGE (KITS) ×2 IMPLANT
KIT HEART LEFT (KITS) ×2 IMPLANT
PACK CARDIAC CATHETERIZATION (CUSTOM PROCEDURE TRAY) ×2 IMPLANT
SHEATH PINNACLE 6F 10CM (SHEATH) ×2 IMPLANT
SYR MEDRAD MARK V 150ML (SYRINGE) ×2 IMPLANT
TRANSDUCER W/STOPCOCK (MISCELLANEOUS) ×2 IMPLANT
TUBING CIL FLEX 10 FLL-RA (TUBING) ×2 IMPLANT
WIRE EMERALD 3MM-J .035X150CM (WIRE) ×2 IMPLANT

## 2017-09-03 NOTE — H&P (Signed)
Cardiology Consultation:   Patient ID: Andre Warren; [id number]; 1946-01-22   Admit date: 09/03/2017 Date of Consult: 09/03/2017  Primary Care Provider: [name], MD [facility] Cardiologist: [facility]   Patient Profile:   Andre Warren is a 72 y.o. male with a hx of coronary artery disease status post coronary artery bypass and graft subdural hematoma, hypertension, hyperlipidemia, renal artery stenosis, carotid endarterectomy with acute inferior myocardial infarction.  History of Present Illness:   Patient is status post coronary artery bypass and graft in 2004.  At that time he had a LIMA to the first obtuse marginal, free RIMA to the LAD and vein graft to the PDA.  Patient is typically followed at the [place] and has not had follow-up in [place].  Patient developed chest pain at 2:30 PM today described as a pressure radiating to both upper extremities.  There is associated nausea and dyspnea as well as diaphoresis.  The pain was persistent and he presented to the emergency room where electrocardiogram consistent with acute inferior myocardial infarction.  Pain is ongoing at time of evaluation.  Past Medical History:  Diagnosis Date  . CAD (coronary artery disease)   . Hypercholesteremia   . Hypertension   . Peripheral arterial disease (HCC)    LCEA  . Subdural hematoma, post-traumatic (HCC) 03/2012   Saw Dr. Danielle Dess has been released.     Past Surgical History:  Procedure Laterality Date  . CAROTID ENDARTERECTOMY  2004  . CORONARY ANGIOPLASTY WITH STENT PLACEMENT     Became occluded and resulted in CABG  . CORONARY ARTERY BYPASS GRAFT     x 3     Inpatient Medications: Scheduled Meds:  Continuous Infusions: . heparin     PRN Meds: fentaNYL, heparin, midazolam  Allergies:    Allergies  Allergen Reactions  . Latex Itching    Social History:   Social History   Socioeconomic History  . Marital status: Married    Spouse name: Not on file  . Number of children:  Not on file  . Years of education: Not on file  . Highest education level: Not on file  Occupational History  . Not on file  Social Needs  . Financial resource strain: Not on file  . Food insecurity:    Worry: Not on file    Inability: Not on file  . Transportation needs:    Medical: Not on file    Non-medical: Not on file  Tobacco Use  . Smoking status: Never Smoker  . Smokeless tobacco: Never Used  Substance and Sexual Activity  . Alcohol use: No  . Drug use: No  . Sexual activity: Yes    Birth control/protection: None  Lifestyle  . Physical activity:    Days per week: Not on file    Minutes per session: Not on file  . Stress: Not on file  Relationships  . Social connections:    Talks on phone: Not on file    Gets together: Not on file    Attends religious service: Not on file    Active member of club or organization: Not on file    Attends meetings of clubs or organizations: Not on file    Relationship status: Not on file  . Intimate partner violence:    Fear of current or ex partner: Not on file    Emotionally abused: Not on file    Physically abused: Not on file    Forced sexual activity: Not on file  Other Topics  Concern  . Not on file  Social History Narrative  . Not on file    Family History:   Family History  Problem Relation Age of Onset  . Hypertension Mother   . Heart disease Mother   . Hypertension Father   . Stroke Father   . Heart disease Father   . Cancer Father   . Cancer Sister   . Heart disease Sister      ROS:  Please see the history of present illness.  Patient denies fevers, chills, productive cough, hemoptysis, melena or hematochezia. All other ROS reviewed and negative.     Physical Exam/Data:  213/114 74 General:  Well nourished, well developed, in no acute distress HEENT: normal Lymph: no adenopathy Neck: no JVD, right carotid bruit.  Status post carotid endarterectomy. Endocrine:  No thryomegaly Vascular: No carotid  bruits; FA pulses 2+ bilaterally without bruits  Cardiac:  normal S1, S2; RRR; no murmur  Lungs:  clear to auscultation bilaterally, no wheezing, rhonchi or rales; status post sternotomy Abd: soft, nontender, no hepatomegaly  Ext: no edema Musculoskeletal:  No deformities, BUE and BLE strength normal and equal Skin: warm and dry  Neuro:  CNs 2-12 intact, no focal abnormalities noted Psych:  Normal affect   EKG:  The EKG was personally reviewed and demonstrates: Normal sinus rhythm with inferior ST elevation and reciprocal ST depression in the lateral leads.  Assessment and Plan:   1. Acute inferior myocardial infarction-patient presents with 3 hours of ongoing chest pressure and electrocardiogram demonstrating acute inferior microinfarction.  Plan emergent cardiac catheterization.  The risks and benefits including myocardial infarction, CVA and death discussed and he agrees to proceed.  We will treat with aspirin, metoprolol and statin. 2. Hypertension-blood pressure significantly elevated.  We will treat with metoprolol and add ARB once it is clear renal function stable.  Follow blood pressure and adjust as needed. 3. Hyperlipidemia-treat with Lipitor 80 mg daily. 4. History of carotid endarterectomy-followed at the [place]. 5. History of subdural hematoma 6. History of chronic stage III renal disease-check baseline laboratories (patient taken straight to the cardiac catheterization laboratory on presentation to the emergency room).  Follow renal function after procedure.   For questions or updates, please contact [facility] Please consult [email or web address] for contact info under Cardiology/STEMI.   Signed, Olga Millers, MD  09/03/2017 5:48 PM

## 2017-09-03 NOTE — Progress Notes (Signed)
Sheath removed by Kari Baars, RN at 22:49. Manual pressure held for 20 minutes. Site level zero, hemostasis achieved. Palpable pulses. No adverse effects. Pt tolerated well. Will continue to monitor.   Peyton Bottoms, RN 09/03/17 11:10 PM

## 2017-09-03 NOTE — Progress Notes (Signed)
ANTICOAGULATION CONSULT NOTE - Initial Consult  Pharmacy Consult for heparin Indication: chest pain/ACS  Allergies  Allergen Reactions  . Latex Itching    Patient Measurements: Height: 5\' 7"  (170.2 cm) Weight: 160 lb 4.4 oz (72.7 kg) IBW/kg (Calculated) : 66.1 Heparin Dosing Weight: 72.7 kg  Vital Signs: Temp: 97.7 F (36.5 C) (06/27 2000) Temp Source: Oral (06/27 2000) BP: 127/75 (06/27 2100) Pulse Rate: 75 (06/27 1900)  Labs: Recent Labs    09/03/17 1752 09/03/17 1753  HGB 13.6 14.0  HCT 40.0 41.1  PLT  --  170  APTT  --  31  LABPROT  --  14.4  INR  --  1.12  CREATININE 1.30* 1.50*  TROPONINI  --  0.08*    Estimated Creatinine Clearance: 42.2 mL/min (A) (by C-G formula based on SCr of 1.5 mg/dL (H)).   Medical History: Past Medical History:  Diagnosis Date  . CAD (coronary artery disease)   . CKD (chronic kidney disease) stage 2, GFR 60-89 ml/min   . H/O agent Orange exposure   . Hypercholesteremia   . Hypertension   . Peripheral arterial disease (HCC)    LCEA  . Subdural hematoma, post-traumatic (HCC) 03/2012   Saw Dr. Danielle Dess has been released.     Medications:  Scheduled:  . [START ON 09/04/2017] aspirin  81 mg Oral Daily  . atorvastatin  80 mg Oral Daily  . atropine      . [START ON 09/04/2017] losartan  25 mg Oral Daily  . metoprolol tartrate  25 mg Oral BID  . sodium chloride flush  3 mL Intravenous Q12H  . [START ON 09/04/2017] ticagrelor  90 mg Oral BID    Assessment: 72 yom with history of CABG x3 in 2004 presented with acute inferior myocardial infarction, code STEMI. Cath findings 3 vessel disease with 2/3 patient bypass grafts without intervention. Plan to start heparin 8 hours after sheath pulled (and tentatively continue for 48 hours).   Sheath pulled on 6/27@2200 . Hgb 14, plt 170. No s/sx of bleeding. No anticoagulation prior to admission. Scr 1.5 (CrCl ~42 mL/min).  Goal of Therapy:  Heparin level 0.3-0.7 units/ml Monitor  platelets by anticoagulation protocol: Yes   Plan:  Start heparin infusion at 850 units/hr starting on 6/28@0600  Check anti-Xa level in 8 hours and daily while on heparin Continue to monitor H&H and platelets  Girard Cooter, PharmD Clinical Pharmacist  Pager: [phone] Phone: [phone] 09/03/2017,9:56 PM

## 2017-09-04 ENCOUNTER — Encounter (HOSPITAL_COMMUNITY): Payer: Self-pay | Admitting: Cardiovascular Disease

## 2017-09-04 ENCOUNTER — Inpatient Hospital Stay (HOSPITAL_COMMUNITY): Payer: No Typology Code available for payment source

## 2017-09-04 ENCOUNTER — Other Ambulatory Visit: Payer: Self-pay

## 2017-09-04 DIAGNOSIS — I2119 ST elevation (STEMI) myocardial infarction involving other coronary artery of inferior wall: Secondary | ICD-10-CM

## 2017-09-04 LAB — BASIC METABOLIC PANEL
Anion gap: 11 (ref 5–15)
BUN: 17 mg/dL (ref 8–23)
CO2: 18 mmol/L — ABNORMAL LOW (ref 22–32)
Calcium: 9.9 mg/dL (ref 8.9–10.3)
Chloride: 107 mmol/L (ref 98–111)
Creatinine, Ser: 1.13 mg/dL (ref 0.61–1.24)
GFR calc Af Amer: >60 mL/min (ref >60)
GLUCOSE: 112 mg/dL — AB (ref 70–99)
Potassium: 4.1 mmol/L (ref 3.5–5.1)
SODIUM: 136 mmol/L (ref 135–145)

## 2017-09-04 LAB — CBC
HCT: 44.8 % (ref 39.0–52.0)
Hemoglobin: 14.5 g/dL (ref 13.0–17.0)
MCH: 32.1 pg (ref 26.0–34.0)
MCHC: 32.4 g/dL (ref 30.0–36.0)
MCV: 99.1 fL (ref 78.0–100.0)
Platelets: 164 10*3/uL (ref 150–400)
RBC: 4.52 MIL/uL (ref 4.22–5.81)
RDW: 14.2 % (ref 11.5–15.5)
WBC: 10.8 10*3/uL — AB (ref 4.0–10.5)

## 2017-09-04 LAB — HEPARIN LEVEL (UNFRACTIONATED): Heparin Unfractionated: 0.31 IU/mL (ref 0.30–0.70)

## 2017-09-04 LAB — ECHOCARDIOGRAM COMPLETE
HEIGHTINCHES: 67 in
Weight: 2564.39 oz

## 2017-09-04 LAB — TROPONIN I
TROPONIN I: 15.33 ng/mL — AB (ref <0.03)
Troponin I: 3.04 ng/mL (ref <0.03)

## 2017-09-04 MED FILL — Heparin Sod (Porcine)-NaCl IV Soln 1000 Unit/500ML-0.9%: INTRAVENOUS | Qty: 500 | Status: AC

## 2017-09-04 NOTE — Progress Notes (Signed)
Pt went into Afib RVR 100-130s bpm at 0520. Cards fellow notified, MD said to wean off nitroglycerin gtt so pt can take scheduled AM lopressor. Pt to start heparin gtt at 0600.   Pt denies CP, N/V, dizziness. Maintaining strong BP.   Will continue to monitor.   Peyton Bottoms, RN 09/04/17

## 2017-09-04 NOTE — Plan of Care (Signed)
  Problem: Education: Goal: Knowledge of General Education information will improve Outcome: Progressing   Problem: Health Behavior/Discharge Planning: Goal: Ability to manage health-related needs will improve Outcome: Progressing   Problem: Clinical Measurements: Goal: Ability to maintain clinical measurements within normal limits will improve Outcome: Progressing Goal: Will remain free from infection Outcome: Progressing Goal: Diagnostic test results will improve Outcome: Progressing Goal: Respiratory complications will improve Outcome: Progressing Goal: Cardiovascular complication will be avoided Outcome: Progressing   Problem: Activity: Goal: Risk for activity intolerance will decrease Outcome: Progressing   Problem: Nutrition: Goal: Adequate nutrition will be maintained Outcome: Progressing   Problem: Coping: Goal: Level of anxiety will decrease Outcome: Progressing   Problem: Elimination: Goal: Will not experience complications related to bowel motility Outcome: Progressing Goal: Will not experience complications related to urinary retention Outcome: Progressing   Problem: Pain Managment: Goal: General experience of comfort will improve Outcome: Progressing   Problem: Safety: Goal: Ability to remain free from injury will improve Outcome: Progressing   Problem: Skin Integrity: Goal: Risk for impaired skin integrity will decrease Outcome: Progressing   Problem: Education: Goal: Understanding of cardiac disease, CV risk reduction, and recovery process will improve Outcome: Progressing Goal: Understanding of medication regimen will improve Outcome: Progressing   Problem: Activity: Goal: Ability to tolerate increased activity will improve Outcome: Progressing   Problem: Cardiac: Goal: Ability to achieve and maintain adequate cardiopulmonary perfusion will improve Outcome: Progressing Goal: Vascular access site(s) Level 0-1 will be maintained Outcome:  Progressing   Problem: Health Behavior/Discharge Planning: Goal: Ability to safely manage health-related needs after discharge will improve Outcome: Progressing   

## 2017-09-04 NOTE — Care Management (Signed)
[facility] contacted [facility] transfer coordinator - no request made for transfer form nor clinicals at this time.  Pt has PCP with [facility] Dr Loleta Books.  Pt is  40% service connected with [facility] - also has Medicare part A and B.  CM contacted [facility] to informed of admit.  Pt also has non [facility] PCP - Dr Oneta Rack

## 2017-09-04 NOTE — Progress Notes (Signed)
ANTICOAGULATION CONSULT NOTE   Pharmacy Consult for heparin Indication: chest pain/ACS  Allergies  Allergen Reactions  . Latex Itching    Patient Measurements: Height: 5\' 7"  (170.2 cm) Weight: 160 lb 4.4 oz (72.7 kg) IBW/kg (Calculated) : 66.1 Heparin Dosing Weight: 72.7 kg  Vital Signs: Temp: 97.7 F (36.5 C) (06/28 1119) Temp Source: Oral (06/28 1119) BP: 102/55 (06/28 1400) Pulse Rate: 94 (06/28 1400)  Labs: Recent Labs    09/03/17 1752  09/03/17 1753 09/03/17 2030 09/04/17 0058 09/04/17 0540 09/04/17 1406  HGB 13.6  --  14.0  --   --  14.5  --   HCT 40.0  --  41.1  --   --  44.8  --   PLT  --   --  170  --   --  164  --   APTT  --   --  31  --   --   --   --   LABPROT  --   --  14.4  --   --   --   --   INR  --   --  1.12  --   --   --   --   HEPARINUNFRC  --   --   --   --   --   --  0.31  CREATININE 1.30*  --  1.50*  --   --  1.13  --   TROPONINI  --    < > 0.08* 0.31* 3.04* 15.33*  --    < > = values in this interval not displayed.    Estimated Creatinine Clearance: 56.1 mL/min (by C-G formula based on SCr of 1.13 mg/dL).   Medical History: Past Medical History:  Diagnosis Date  . CAD (coronary artery disease)   . CKD (chronic kidney disease) stage 2, GFR 60-89 ml/min   . H/O agent Orange exposure   . Hypercholesteremia   . Hypertension   . Peripheral arterial disease (HCC)    LCEA  . Subdural hematoma, post-traumatic (HCC) 03/2012   Saw Dr. Danielle Dess has been released.     Medications:  Scheduled:  . aspirin  81 mg Oral Daily  . atorvastatin  80 mg Oral Daily  . losartan  25 mg Oral Daily  . metoprolol tartrate  25 mg Oral BID  . sodium chloride flush  3 mL Intravenous Q12H  . ticagrelor  90 mg Oral BID    Assessment: 72 yom with history of CABG x3 in 2004 presented with acute inferior myocardial infarction, code STEMI. Cath findings 3 vessel disease with 2/3 patient bypass grafts without intervention. Plan to start heparin 8 hours after  sheath pulled (and tentatively continue for 48 hours).   Heparin level at goal this afternoon on 850 units/hr. No bleeding issues noted. Will increase rate slightly to keep within range.   Goal of Therapy:  Heparin level 0.3-0.7 units/ml Monitor platelets by anticoagulation protocol: Yes   Plan:  Increase heparin infusion to 950 units/hr Check anti-Xa level in 8 hours and daily while on heparin Continue to monitor H&H and platelets  Sheppard Coil PharmD., BCPS Clinical Pharmacist 09/04/2017 3:07 PM

## 2017-09-04 NOTE — Progress Notes (Signed)
Nutrition Brief Note  Patient identified on the Malnutrition Screening Tool (MST) Report  Wt Readings from Last 15 Encounters:  09/03/17 160 lb 4.4 oz (72.7 kg)  08/12/16 170 lb (77.1 kg)  07/25/15 174 lb 3.2 oz (79 kg)  06/12/14 182 lb 12.8 oz (82.9 kg)  03/21/14 180 lb (81.6 kg)  11/15/13 174 lb (78.9 kg)  02/22/13 176 lb (79.8 kg)  03/15/12 181 lb 6.4 oz (82.3 kg)   Pt denies major weight loss. Pt reports this time last year he weighed around 165 pounds. Pt with 8% weight loss in 2 years which is not significant for time frame  Body mass index is 25.1 kg/m.   Current diet order is Heart Healthy, patient is consuming approximately 50% of meals at this time. Pt reports good appetite at home and PTA. Labs and medications reviewed.   No nutrition interventions warranted at this time. If nutrition issues arise, please consult RD.   Romelle Starcher MS, RD, LDN, CNSC [phone] Pager  [phone] Weekend/On-Call Pager

## 2017-09-04 NOTE — Progress Notes (Signed)
  Echocardiogram 2D Echocardiogram has been performed.  Roosvelt Maser F 09/04/2017, 3:04 PM

## 2017-09-04 NOTE — Progress Notes (Signed)
Patient went straight to cath lab. No family was present and patient was not available. Phebe Colla, Chaplain   09/04/17 0500  Clinical Encounter Type  Visited With Patient not available  Visit Type Other (Comment) (straight to Cath lab)  Referral From Physician  Consult/Referral To Chaplain

## 2017-09-04 NOTE — Progress Notes (Signed)
Progress Note  Patient Name: Andre Warren Date of Encounter: 09/04/2017  [facility]  Subjective   No chest pain or dyspnea  Inpatient Medications    Scheduled Meds: . aspirin  81 mg Oral Daily  . atorvastatin  80 mg Oral Daily  . losartan  25 mg Oral Daily  . metoprolol tartrate  25 mg Oral BID  . sodium chloride flush  3 mL Intravenous Q12H  . ticagrelor  90 mg Oral BID   Continuous Infusions: . sodium chloride    . heparin 850 Units/hr (09/04/17 0700)  . nitroGLYCERIN 5 mcg/min (09/04/17 0700)   PRN Meds: sodium chloride, acetaminophen, morphine injection, ondansetron (ZOFRAN) IV, oxyCODONE, sodium chloride flush   Vital Signs    Vitals:   09/04/17 0700 09/04/17 0715 09/04/17 0730 09/04/17 0745  BP: (!) 133/94 136/82  129/80  Pulse: 95 95 (!) 106 (!) 108  Resp: 20 19 (!) 24 (!) 30  Temp:      TempSrc:      SpO2: 97% 97% 94% 96%  Weight:      Height:        Intake/Output Summary (Last 24 hours) at 09/04/2017 0756 Last data filed at 09/04/2017 0700 Gross per 24 hour  Intake 1306.84 ml  Output 250 ml  Net 1056.84 ml   Filed Weights   09/03/17 1742 09/03/17 1900  Weight: 170 lb (77.1 kg) 160 lb 4.4 oz (72.7 kg)    Telemetry    Sinus to atrial fibrillation; NSVT- Personally Reviewed  Physical Exam   GEN: No acute distress.   Neck: No JVD Cardiac: irregular Respiratory: Clear to auscultation bilaterally. GI: Soft, nontender, non-distended  MS: No edema; right groin with no hematoma and no bruit Neuro:  Nonfocal  Psych: Normal affect   Labs    Chemistry Recent Labs  Lab 09/03/17 1752 09/03/17 1753 09/04/17 0540  NA 138 136 136  K 4.2 4.2 4.1  CL 106 107 107  CO2  --  17* 18*  GLUCOSE 129* 122* 112*  BUN 18 16 17   CREATININE 1.30* 1.50* 1.13  CALCIUM  --  10.0 9.9  PROT  --  7.2  --   ALBUMIN  --  4.4  --   AST  --  21  --   ALT  --  13  --   ALKPHOS  --  76  --   BILITOT  --  2.5*  --   GFRNONAA  --  45* >60    GFRAA  --  52* >60  ANIONGAP  --  12 11     Hematology Recent Labs  Lab 09/03/17 1752 09/03/17 1753 09/04/17 0540  WBC  --  11.0* 10.8*  RBC  --  4.30 4.52  HGB 13.6 14.0 14.5  HCT 40.0 41.1 44.8  MCV  --  95.6 99.1  MCH  --  32.6 32.1  MCHC  --  34.1 32.4  RDW  --  13.4 14.2  PLT  --  170 164    Cardiac Enzymes Recent Labs  Lab 09/03/17 1753 09/03/17 2030 09/04/17 0058 09/04/17 0540  TROPONINI 0.08* 0.31* 3.04* 15.33*    Patient Profile     72 y.o. male with past medical history of coronary artery disease status post coronary artery bypass and graft admitted with acute inferior myocardial infarction.  Cardiac catheterization demonstrated occluded  native coronaries, occluded saphenous vein graft to the PDA, patent LIMA to the obtuse marginal and patent free RIMA  to the LAD.  There were collaterals to the right coronary artery.  Patient therefore treated medically.  Assessment & Plan    1. Acute inferior myocardial infarction-as outlined above patient is status post inferior myocardial infarction treated medically.  I will arrange an echocardiogram to further assess LV function though appears to be preserved at time of catheterization.  Continue aspirin, Brilinta, beta-blocker and statin.  We will continue heparin for 48 hours and then discontinue.  Discontinue IV nitroglycerin. 2. Hypertension-blood pressure improved this morning.  Continue present medications and advance as needed. 3. Hyperlipidemia-treat with Lipitor 80 mg daily.  Check lipids and liver in 4 weeks.  4. History of carotid endarterectomy-followed at the [place]. 5. History of subdural hematoma-occurred after falling off a ladder.  6. History of chronic stage III renal disease-renal function stable this morning.  Will recheck in a.m. 7. Paroxysmal atrial fibrillation-patient has developed atrial fibrillation likely secondary to his recent myocardial infarction.  Continue metoprolol for rate control.  Hopefully  he will convert spontaneously.  Chadsvasc 3.  If atrial fibrillation persists he will require apixaban.  I would like to avoid this given need for aspirin and Brilinta at this point.  For questions or updates, please contact [facility] Please consult [email or web address] for contact info under Cardiology/STEMI.      Signed, Olga Millers, MD  09/04/2017, 7:56 AM

## 2017-09-04 NOTE — Progress Notes (Signed)
CARDIAC REHAB PHASE I   PRE:  Rate/Rhythm: 96 aflutter  BP:  Supine: 102/53  Sitting:   Standing:    SaO2: 98%RA  MODE:  Ambulation: 340 ft   POST:  Rate/Rhythm: 120's aflutter to 93  BP:  Supine:   Sitting: 93/78  Standing:    SaO2: 94%RA 1405-1500 When I first tried to walk with pt his HR went from 96 to 136 just standing. Assisted to recliner and completed MI education. I tried again after ed and heart rate a little more controlled. Pt walked 340 ft with monitoring and a flutter 93 to high 120's .  Denied dizziness or CP. BP lower after walk. Discussed MI restrictions, NTG use, heart healthy diet, risk factors, ex ed and CRP 2. Referred to [place]. Discussed brilinta. Will need to see case manager if he remains on it.   Luetta Nutting, RN BSN  09/04/2017 2:57 PM

## 2017-09-05 LAB — BASIC METABOLIC PANEL
Anion gap: 5 (ref 5–15)
BUN: 19 mg/dL (ref 8–23)
CHLORIDE: 107 mmol/L (ref 98–111)
CO2: 25 mmol/L (ref 22–32)
CREATININE: 1.27 mg/dL — AB (ref 0.61–1.24)
Calcium: 9.9 mg/dL (ref 8.9–10.3)
GFR calc Af Amer: >60 mL/min (ref >60)
GFR calc non Af Amer: 55 mL/min — ABNORMAL LOW (ref >60)
GLUCOSE: 93 mg/dL (ref 70–99)
Potassium: 4.4 mmol/L (ref 3.5–5.1)
SODIUM: 137 mmol/L (ref 135–145)

## 2017-09-05 LAB — CBC
HCT: 43.8 % (ref 39.0–52.0)
HEMOGLOBIN: 14.1 g/dL (ref 13.0–17.0)
MCH: 32.6 pg (ref 26.0–34.0)
MCHC: 32.2 g/dL (ref 30.0–36.0)
MCV: 101.4 fL — ABNORMAL HIGH (ref 78.0–100.0)
Platelets: 159 10*3/uL (ref 150–400)
RBC: 4.32 MIL/uL (ref 4.22–5.81)
RDW: 14.2 % (ref 11.5–15.5)
WBC: 10.6 10*3/uL — ABNORMAL HIGH (ref 4.0–10.5)

## 2017-09-05 LAB — HEPARIN LEVEL (UNFRACTIONATED): Heparin Unfractionated: 0.47 IU/mL (ref 0.30–0.70)

## 2017-09-05 MED ORDER — APIXABAN 5 MG PO TABS
5.0000 mg | ORAL_TABLET | Freq: Two times a day (BID) | ORAL | Status: DC
  Administered 2017-09-05 – 2017-09-06 (×3): 5 mg via ORAL
  Filled 2017-09-05 (×3): qty 1

## 2017-09-05 MED ORDER — METOPROLOL TARTRATE 25 MG PO TABS
25.0000 mg | ORAL_TABLET | Freq: Three times a day (TID) | ORAL | Status: DC
  Administered 2017-09-05 – 2017-09-06 (×4): 25 mg via ORAL
  Filled 2017-09-05 (×4): qty 1

## 2017-09-05 NOTE — Progress Notes (Signed)
CARDIAC REHAB PHASE I   PRE:  Rate/Rhythm: Sinus  76  BP:    Sitting: 100/66  Standing: 92/73   SaO2: 99% Room Air  MODE:  Ambulation: 450 ft   POST:  Rate/Rhythem: 89  BP:    Sitting: 112/78     SaO2: 99% Room Air [phone] Patient ambulated independently in the hallway without complaints or difficulty. Patient tolerated ambulation well. Patient assisted back to recliner with call bell within reach.Gladstone Lighter, RN,BSN 09/05/2017 12:08 PM   Thayer Headings

## 2017-09-05 NOTE — Progress Notes (Addendum)
Progress Note  Patient Name: Andre Warren Date of Encounter: 09/05/2017  [facility]  Subjective   Pt denies CP or dyspnea  Inpatient Medications    Scheduled Meds: . aspirin  81 mg Oral Daily  . atorvastatin  80 mg Oral Daily  . losartan  25 mg Oral Daily  . metoprolol tartrate  25 mg Oral BID  . sodium chloride flush  3 mL Intravenous Q12H  . ticagrelor  90 mg Oral BID   Continuous Infusions: . sodium chloride    . heparin 950 Units/hr (09/05/17 0600)   PRN Meds: sodium chloride, acetaminophen, morphine injection, ondansetron (ZOFRAN) IV, oxyCODONE, sodium chloride flush   Vital Signs    Vitals:   09/05/17 0300 09/05/17 0400 09/05/17 0500 09/05/17 0600  BP: 111/75 93/68 (!) 75/47 102/67  Pulse: 81 74 78 67  Resp: (!) 24 18 (!) 25 (!) 24  Temp:  98.8 F (37.1 C)    TempSrc:  Oral    SpO2: 98% 97% 98% 97%  Weight:      Height:        Intake/Output Summary (Last 24 hours) at 09/05/2017 0740 Last data filed at 09/05/2017 0600 Gross per 24 hour  Intake 810.51 ml  Output 400 ml  Net 410.51 ml   Filed Weights   09/03/17 1742 09/03/17 1900  Weight: 170 lb (77.1 kg) 160 lb 4.4 oz (72.7 kg)    Telemetry    Atrial fibrillation- Personally Reviewed  Physical Exam   GEN: WD/WN No acute distress.   Neck: No JVD, supple Cardiac: irregular Respiratory: Clear to auscultation bilaterally; no wheeze GI: Soft, NT/ND MS: Right groin with no hematoma and no bruit Neuro: Grossly intact   Labs    Chemistry Recent Labs  Lab 09/03/17 1753 09/04/17 0540 09/05/17 0237  NA 136 136 137  K 4.2 4.1 4.4  CL 107 107 107  CO2 17* 18* 25  GLUCOSE 122* 112* 93  BUN 16 17 19   CREATININE 1.50* 1.13 1.27*  CALCIUM 10.0 9.9 9.9  PROT 7.2  --   --   ALBUMIN 4.4  --   --   AST 21  --   --   ALT 13  --   --   ALKPHOS 76  --   --   BILITOT 2.5*  --   --   GFRNONAA 45* >60 55*  GFRAA 52* >60 >60  ANIONGAP 12 11 5      Hematology Recent Labs  Lab  09/03/17 1753 09/04/17 0540 09/05/17 0237  WBC 11.0* 10.8* 10.6*  RBC 4.30 4.52 4.32  HGB 14.0 14.5 14.1  HCT 41.1 44.8 43.8  MCV 95.6 99.1 101.4*  MCH 32.6 32.1 32.6  MCHC 34.1 32.4 32.2  RDW 13.4 14.2 14.2  PLT 170 164 159    Cardiac Enzymes Recent Labs  Lab 09/03/17 1753 09/03/17 2030 09/04/17 0058 09/04/17 0540  TROPONINI 0.08* 0.31* 3.04* 15.33*    Patient Profile     72 y.o. male with past medical history of coronary artery disease status post coronary artery bypass and graft admitted with acute inferior myocardial infarction.  Cardiac catheterization demonstrated occluded  native coronaries, occluded saphenous vein graft to the PDA, patent LIMA to the obtuse marginal and patent free RIMA to the LAD.  There were collaterals to the right coronary artery.  Patient therefore treated medically.  Assessment & Plan    1. Acute inferior myocardial infarction-as outlined in previous notes plan is medical therapy.  Echocardiogram shows preserved LV function.  I will continue with aspirin, beta-blocker and statin.  His atrial fibrillation persists and he will require anticoagulation with apixaban.  I will therefore discontinue Brilinta.  Patient did not have PCI at time of catheterization.  Discontinue heparin and IV nitroglycerin.   2. Hypertension-blood pressure is low.  Discontinue ARB.  Discontinue nitroglycerin.  Follow blood pressure and adjust regimen as needed. 3. Hyperlipidemia-treat with Lipitor 80 mg daily.  Check lipids and liver in 4 weeks.  4. History of carotid endarterectomy-followed at the [place]. 5. History of subdural hematoma-occurred after falling off a ladder.  6. History of chronic stage III renal disease-creatinine slightly worse today.  Will recheck in a.m. 7. Paroxysmal atrial fibrillation-patient remains in atrial fibrillation today.  Rate mildly elevated.  Increase metoprolol to 25 mg p.o. 3 times daily.  Transition to Toprol tomorrow if blood pressure and  heart rate stable.  Begin apixaban 5 mg twice daily.  He is asymptomatic.  Would plan rate control and anticoagulation and if atrial fibrillation persists at follow-up would plan to proceed with cardioversion.  Atrial fibrillation may be secondary to recent myocardial infarction.    Transfer to telemetry.  Ambulate today.  Discharge tomorrow morning if stable.  For questions or updates, please contact [facility] Please consult [email or web address] for contact info under Cardiology/STEMI.      Signed, Olga Millers, MD  09/05/2017, 7:40 AM

## 2017-09-05 NOTE — Plan of Care (Signed)
Report called to [facility]. Discussed all PMH, current status, and current plan of care. Pt notified family of transfer via personal cell phone.

## 2017-09-06 LAB — CBC
HCT: 42.7 % (ref 39.0–52.0)
HEMOGLOBIN: 13.9 g/dL (ref 13.0–17.0)
MCH: 32.4 pg (ref 26.0–34.0)
MCHC: 32.6 g/dL (ref 30.0–36.0)
MCV: 99.5 fL (ref 78.0–100.0)
PLATELETS: 185 10*3/uL (ref 150–400)
RBC: 4.29 MIL/uL (ref 4.22–5.81)
RDW: 14.1 % (ref 11.5–15.5)
WBC: 10.6 10*3/uL — ABNORMAL HIGH (ref 4.0–10.5)

## 2017-09-06 LAB — BASIC METABOLIC PANEL
Anion gap: 7 (ref 5–15)
BUN: 23 mg/dL (ref 8–23)
CALCIUM: 9.9 mg/dL (ref 8.9–10.3)
CO2: 26 mmol/L (ref 22–32)
CREATININE: 1.42 mg/dL — AB (ref 0.61–1.24)
Chloride: 105 mmol/L (ref 98–111)
GFR, EST AFRICAN AMERICAN: 56 mL/min — AB (ref >60)
GFR, EST NON AFRICAN AMERICAN: 48 mL/min — AB (ref >60)
Glucose, Bld: 100 mg/dL — ABNORMAL HIGH (ref 70–99)
Potassium: 4.2 mmol/L (ref 3.5–5.1)
SODIUM: 138 mmol/L (ref 135–145)

## 2017-09-06 MED ORDER — APIXABAN 5 MG PO TABS: 5.0000 mg | ORAL_TABLET | Freq: Two times a day (BID) | ORAL | 6 refills | Status: DC

## 2017-09-06 MED ORDER — METOPROLOL TARTRATE 25 MG PO TABS: 25.0000 mg | ORAL_TABLET | Freq: Two times a day (BID) | ORAL | 6 refills | Status: AC

## 2017-09-06 MED ORDER — APIXABAN 5 MG PO TABS: 5.0000 mg | ORAL_TABLET | Freq: Two times a day (BID) | ORAL | 0 refills | Status: AC

## 2017-09-06 MED ORDER — ATORVASTATIN CALCIUM 80 MG PO TABS: 80.0000 mg | ORAL_TABLET | Freq: Every day | ORAL | 6 refills | Status: AC

## 2017-09-06 NOTE — Progress Notes (Signed)
Progress Note  Patient Name: Andre Warren Date of Encounter: 09/06/2017  [facility]: [facility]  Subjective   No CP or dyspnea  Inpatient Medications    Scheduled Meds: . apixaban  5 mg Oral BID  . aspirin  81 mg Oral Daily  . atorvastatin  80 mg Oral Daily  . metoprolol tartrate  25 mg Oral TID  . sodium chloride flush  3 mL Intravenous Q12H   Continuous Infusions: . sodium chloride     PRN Meds: sodium chloride, acetaminophen, morphine injection, ondansetron (ZOFRAN) IV, oxyCODONE, sodium chloride flush   Vital Signs    Vitals:   09/05/17 1646 09/05/17 2032 09/05/17 2205 09/06/17 0545  BP: 101/85 (!) 80/56 109/65 103/78  Pulse: (!) 51 78 79 69  Resp:  15  17  Temp: 98 F (36.7 C) 98.2 F (36.8 C)  98.7 F (37.1 C)  TempSrc: Oral Oral  Oral  SpO2: 100% 100%  99%  Weight:    160 lb 4.8 oz (72.7 kg)  Height:        Intake/Output Summary (Last 24 hours) at 09/06/2017 0743 Last data filed at 09/06/2017 0659 Gross per 24 hour  Intake 1545 ml  Output -  Net 1545 ml   Filed Weights   09/03/17 1742 09/03/17 1900 09/06/17 0545  Weight: 170 lb (77.1 kg) 160 lb 4.4 oz (72.7 kg) 160 lb 4.8 oz (72.7 kg)    Telemetry    Sinus with nonconducted pacs- Personally Reviewed  Physical Exam   GEN: WD/WN NAD Neck: supple Cardiac: irregular, no murmur Respiratory: Clear to auscultation bilaterally; no rhonchi GI: Soft, NT/ND, no masses; right groin with no hematoma and no bruit MS: No edema Neuro: no focal findings   Labs    Chemistry Recent Labs  Lab 09/03/17 1753 09/04/17 0540 09/05/17 0237  NA 136 136 137  K 4.2 4.1 4.4  CL 107 107 107  CO2 17* 18* 25  GLUCOSE 122* 112* 93  BUN 16 17 19   CREATININE 1.50* 1.13 1.27*  CALCIUM 10.0 9.9 9.9  PROT 7.2  --   --   ALBUMIN 4.4  --   --   AST 21  --   --   ALT 13  --   --   ALKPHOS 76  --   --   BILITOT 2.5*  --   --   GFRNONAA 45* >60 55*  GFRAA 52* >60 >60  ANIONGAP 12 11 5       Hematology Recent Labs  Lab 09/03/17 1753 09/04/17 0540 09/05/17 0237  WBC 11.0* 10.8* 10.6*  RBC 4.30 4.52 4.32  HGB 14.0 14.5 14.1  HCT 41.1 44.8 43.8  MCV 95.6 99.1 101.4*  MCH 32.6 32.1 32.6  MCHC 34.1 32.4 32.2  RDW 13.4 14.2 14.2  PLT 170 164 159    Cardiac Enzymes Recent Labs  Lab 09/03/17 1753 09/03/17 2030 09/04/17 0058 09/04/17 0540  TROPONINI 0.08* 0.31* 3.04* 15.33*    Patient Profile     72 y.o. male with past medical history of coronary artery disease status post coronary artery bypass and graft admitted with acute inferior myocardial infarction.  Cardiac catheterization demonstrated occluded  native coronaries, occluded saphenous vein graft to the PDA, patent LIMA to the obtuse marginal and patent free RIMA to the LAD.  There were collaterals to the right coronary artery.  Patient therefore treated medically.  Assessment & Plan    1. Acute inferior myocardial infarction-as outlined in previous notes plan  is medical therapy.  Echocardiogram shows preserved LV function.  Continue aspirin and statin.  Blood pressure intermittently low.  Decrease metoprolol to 25 mg twice daily. 2. Hypertension-blood pressure is low.  Decrease metoprolol to 25 mg twice daily. 3. Hyperlipidemia-treat with Lipitor 80 mg daily.  Check lipids and liver in 4 weeks.  4. History of carotid endarterectomy-followed at the [place]. 5. History of subdural hematoma-occurred after falling off a ladder.  6. History of chronic stage III renal disease-renal function stable. 7. Paroxysmal atrial fibrillation-patient has converted to sinus rhythm. Mildly bradycardic at times.  Decrease metoprolol to 25 mg twice daily.  Continue apixaban.    Plan discharge today. Patient would like to follow-up at the [place].  I have asked him to contact our office if he has difficulty scheduling appointments in the near future.  I would like for him to be seen in the next 1 to 2 weeks.  If unable to arrange at the [place] he  will contact us for transition of care appointment in 1 week with APP.  > 30 min PA and physician time D2  For questions or updates, please contact [facility] Please consult [email or web address] for contact info under Cardiology/STEMI.      Signed, Olga Millers, MD  09/06/2017, 7:43 AM

## 2017-09-06 NOTE — Discharge Summary (Signed)
Discharge Summary    Patient ID: Andre Warren,  MRN: [id number], DOB/AGE: [birth year] 72 y.o.  Admit date: 09/03/2017 Discharge date: 09/06/2017  Primary Care Provider: [name] Primary Cardiologist: [facility]  Discharge Diagnoses    Principal Problem:   Acute ST elevation myocardial infarction (STEMI) of inferior wall [facility]) Active Problems:   CAD (coronary artery disease)   Hyperlipidemia   Hypertension   Acute myocardial infarction [facility])   History of Present Illness     Andre Warren is a 72 y.o. male with past medical history of CAD (s/p CABG in 2004 with LIMA-OM1, free RIMA-LAD, and SVG-PDA), HTN, HLD, Stage 3 CKD, carotid artery stenosis, and renal artery stenosis who presented to [facility] ED on 09/03/2017 for evaluation of chest pain.   He reported ongoing symptoms for 3 hours prior to his arrival and initial EKG showed inferior ST elevation with reciprocal ST depression along the lateral leads. Therefore, CODE STEMI was activated and he was taken for an emergent cardiac catheterization.    Hospital Course     Consultants: None  This was performed by Dr. Clifton James and showed triple vessel CAD with 2/3 patent bypass grafts. The SVG-PDA was acutely occluded with further details as outlined above. There were no interventional options noted as the vein graft to the PDA was occluded and heavily thrombotic and there were brisk collaterals to the distal branches of the RCA with the vein graft supplying one of these small branches. Continued medical management was recommended and he was continued on Heparin for 48 hours.  The following morning, he denied any recurrent chest pain or dyspnea. An echocardiogram was performed which showed a preserved EF of 55-60% with mild hypokinesis of the basal-inferior myocardium. He did go into atrial fibrillation during admission, therefore Brilinta was discontinued since he did not require PCI and he was started on  Eliquis 5mg  BID for anticoagulation along with remaining on ASA 81mg  daily.   On 09/06/2017, he reported overall doing well and was ambulating without recurrent anginal symptoms. He was last examined by Dr. Jens Som and deemed stable for discharge. He will continue on ASA, Eliquis 5mg  BID, Metoprolol 25mg  BID, and Lipitor 80mg  daily. He plans to follow-up at the [place] but was informed to reach out to our office if unable to see them within the next 2 weeks. He was discharged home in good condition.   _____________  Discharge Vitals Blood pressure 103/78, pulse 69, temperature 98.7 F (37.1 C), temperature source Oral, resp. rate 17, height 5\' 7"  (1.702 m), weight 160 lb 4.8 oz (72.7 kg), SpO2 99 %.  Filed Weights   09/03/17 1742 09/03/17 1900 09/06/17 0545  Weight: 170 lb (77.1 kg) 160 lb 4.4 oz (72.7 kg) 160 lb 4.8 oz (72.7 kg)    Labs & Radiologic Studies     CBC Recent Labs    09/04/17 0540 09/05/17 0237  WBC 10.8* 10.6*  HGB 14.5 14.1  HCT 44.8 43.8  MCV 99.1 101.4*  PLT 164 159   Basic Metabolic Panel Recent Labs    [birth year] 0540 09/05/17 0237  NA 136 137  K 4.1 4.4  CL 107 107  CO2 18* 25  GLUCOSE 112* 93  BUN 17 19  CREATININE 1.13 1.27*  CALCIUM 9.9 9.9   Liver Function Tests Recent Labs    09/03/17 1753  AST 21  ALT 13  ALKPHOS 76  BILITOT 2.5*  PROT 7.2  ALBUMIN 4.4   No results  for input(s): LIPASE, AMYLASE in the last 72 hours. Cardiac Enzymes Recent Labs    09/03/17 2030 09/04/17 0058 09/04/17 0540  TROPONINI 0.31* 3.04* 15.33*   BNP Invalid input(s): POCBNP D-Dimer No results for input(s): DDIMER in the last 72 hours. Hemoglobin A1C Recent Labs    09/03/17 1752  HGBA1C 5.4   Fasting Lipid Panel Recent Labs    09/03/17 1753  CHOL 140  HDL 39*  LDLCALC 86  TRIG 73  CHOLHDL 3.6   Thyroid Function Tests No results for input(s): TSH, T4TOTAL, T3FREE, THYROIDAB in the last 72 hours.  Invalid input(s): FREET3  No results  found.   Diagnostic Studies/Procedures      Cardiac Catheterization: 09/03/2017  Mid RCA lesion is 100% stenosed.  SVG graft was visualized by angiography.  Prox Graft lesion is 100% stenosed.  Prox Cx to Mid Cx lesion is 100% stenosed.  LIMA graft was visualized by angiography.  RIMA graft was visualized by angiography.  Prox LAD lesion is 100% stenosed.  The left ventricular systolic function is normal.  LV end diastolic pressure is normal.  The left ventricular ejection fraction is 50-55% by visual estimate.  There is no mitral valve regurgitation.   1. Triple vessel CAD s/p 3V CABG with 2/3 patent bypass grafts 2. Acute MI felt to be due to the acute occlusion of the SVG to the PDA. The RCA is chronically occluded in the mid segment, unchanged from pre-bypass cath films. The PDA is noted to be small and diffusely diseased on the operative report from 2004. The cath films from 2004 are reviewed and confirm a small PDA which was likely the target of his occluded vein graft. This vein graft appears to be heavily thrombotic. The PDA and posterolateral artery is seen to fill from left to right collaterals from the LAD.  3. The LAD is occluded in the mid segment. The RIMA graft is patent and fills the mid and distal LAD 4. The Circumflex is occluded leading into a large obtuse marginal branch. The LIMA graft to the OM is patent.  5. Inferior wall hypokinesis with overall preserved LV systolic function  Recommendations: No interventional options for acute MI. I have reviewed his films tonight with Dr. Katrinka Blazing. The LAD and Circumflex territories are ok. The vein graft to the PDA is now occluded but is heavily thrombotic. There are brisk collaterals to the distal branches of the RCA tonight. The vein graft supplied one of these small branches. I do not think opening the vein graft would give him any long term benefit. He is still having chest pain. Will start IV heparin 8 hours post  sheath pull. Will continue ASA, Brilinta, high intensity statin and beta blocker. I will continue IV NTG tonight. Will plan 48 hours of heparin infusion along with DAPT. Will arrange echo tomorrow. Attempt pain control tonight with narcotics.   Echocardiogram: 09/04/2017 Study Conclusions  - Left ventricle: The cavity size was normal. Wall thickness was   normal. Systolic function was normal. The estimated ejection   fraction was in the range of 55% to 60%. Mild hypokinesis of the   basalinferior myocardium. - Mitral valve: There was mild to moderate regurgitation directed   posteriorly. - Left atrium: The atrium was mildly dilated. - Right ventricle: The cavity size was mildly dilated. - Right atrium: The atrium was mildly dilated. - Atrial septum: No defect or patent foramen ovale was identified.   Disposition   Pt is being discharged home today  in good condition.  Follow-up Plans & Appointments    Follow-up Information    Lewayne Bunting, MD Follow up.   Specialty:  Cardiology Why:  If unable to [facility] within [facility], please contact our office for a Hospital follow-up appointment.  Contact information: [address] [place] [address] [phone]          Discharge Instructions    Amb Referral to Cardiac Rehabilitation   Complete by:  As directed    Diagnosis:  STEMI   Diet - low sodium heart healthy   Complete by:  As directed    Discharge instructions   Complete by:  As directed    PLEASE REMEMBER TO BRING ALL OF YOUR MEDICATIONS TO EACH OF YOUR FOLLOW-UP OFFICE VISITS.  PLEASE ATTEND ALL SCHEDULED FOLLOW-UP APPOINTMENTS.   Activity: Increase activity slowly as tolerated. You may shower, but no soaking baths (or swimming) for 1 week. You may not return to work until cleared by your cardiologist. No lifting over 10 lbs for 4 weeks. No sexual activity for 4 weeks.   Wound Care: You may wash cath site  gently with soap and water. Keep cath site clean and dry. If you notice pain, swelling, bleeding or pus at your cath site, please call [phone].   Increase activity slowly   Complete by:  As directed       Discharge Medications     Medication List    STOP taking these medications   isosorbide dinitrate 20 MG tablet Commonly known as:  ISORDIL   losartan 50 MG tablet Commonly known as:  COZAAR     TAKE these medications   apixaban 5 MG Tabs tablet Commonly known as:  ELIQUIS Take 1 tablet (5 mg total) by mouth 2 (two) times daily.   atorvastatin 80 MG tablet Commonly known as:  LIPITOR Take 1 tablet (80 mg total) by mouth daily. What changed:  how much to take   BABY ASPIRIN PO Take 81 mg by mouth daily.   metoprolol tartrate 25 MG tablet Commonly known as:  LOPRESSOR Take 1 tablet (25 mg total) by mouth 2 (two) times daily. What changed:    medication strength  additional instructions         Allergies Allergies  Allergen Reactions  . Latex Itching     Acute coronary syndrome (MI, NSTEMI, STEMI, etc) this admission?: Yes.     AHA/ACC Clinical Performance & Quality Measures: 1. Aspirin prescribed? - Yes 2. ADP Receptor Inhibitor (Plavix/Clopidogrel, Brilinta/Ticagrelor or Effient/Prasugrel) prescribed (includes medically managed patients)? - No - On anticoagulation 3. Beta Blocker prescribed? - Yes 4. High Intensity Statin (Lipitor 40-80mg  or Crestor 20-40mg ) prescribed? - Yes 5. EF assessed during THIS hospitalization? - Yes 6. For EF <40%, was ACEI/ARB prescribed? - Not Applicable (EF >/= 40%) 7. For EF <40%, Aldosterone Antagonist (Spironolactone or Eplerenone) prescribed? - Not Applicable (EF >/= 40%) 8. Cardiac Rehab Phase II ordered (Included Medically managed Patients)? - Yes    Outstanding Labs/Studies   FLP and LFT's in 6 weeks.   Duration of Discharge Encounter   Greater than 30 minutes including physician time.  Signed, Ellsworth Lennox, PA-C 09/06/2017, 8:22 AM

## 2017-09-06 NOTE — Progress Notes (Signed)
Patient had 2.0, 2.05 seconds pause last night per CCMD.  Patient was in bed with eyes closed. Heart rate in the 40's.

## 2017-09-06 NOTE — Progress Notes (Signed)
Referral for Eliquis 30 day free card- per bedside RN- card provided prior to discharge.

## 2017-09-08 ENCOUNTER — Telehealth (HOSPITAL_COMMUNITY): Payer: Self-pay

## 2017-09-08 NOTE — Telephone Encounter (Signed)
Called patient to see if he was interested in the Cardiac Rehab Program as he bills through the [place]. Patient stated he is not interested. Closed referral.

## 2017-10-23 ENCOUNTER — Telehealth (HOSPITAL_COMMUNITY): Payer: Self-pay

## 2017-10-23 NOTE — Telephone Encounter (Signed)
Attempted to contact Andre Warren at the [facility] to request office notes from F/u and recent 12Lead/EKG post MI - lm on vm

## 2017-11-24 ENCOUNTER — Telehealth (HOSPITAL_COMMUNITY): Payer: Self-pay

## 2017-11-24 NOTE — Telephone Encounter (Signed)
Attempted to contact patient to receive information on which Cardiologist he is going to follow up with - vm has not been set up. Sending letter.

## 2017-12-01 ENCOUNTER — Telehealth (HOSPITAL_COMMUNITY): Payer: Self-pay

## 2017-12-01 NOTE — Telephone Encounter (Signed)
Called and spoke with patient in regards to [place] information - patient gave me contact number for Dr.Humphreys office. Informed patient what we were waiting on. He verbalized understanding.  Attempted to contact charge nurse to request recent 12Lead/EKG and office notes from f/u visit on 09/08/17 - lm on vm

## 2017-12-02 ENCOUNTER — Telehealth (HOSPITAL_COMMUNITY): Payer: Self-pay

## 2017-12-02 ENCOUNTER — Encounter (HOSPITAL_COMMUNITY): Payer: Self-pay

## 2017-12-02 NOTE — Telephone Encounter (Signed)
Attempted to contact pt in regards to Cardiac Rehab - pt does not have his VM set up. Mailed letter

## 2017-12-04 ENCOUNTER — Telehealth (HOSPITAL_COMMUNITY): Payer: Self-pay

## 2017-12-04 NOTE — Telephone Encounter (Signed)
Patient returned phone call in regards to Cardiac Rehab - Scheduled orientation on 01/26/18@ 8:15am. Patient will attend the 2:45pm exc class. Mailed packet.

## 2018-01-20 ENCOUNTER — Telehealth (HOSPITAL_COMMUNITY): Payer: Self-pay | Admitting: Pharmacy Technician

## 2018-01-20 NOTE — Telephone Encounter (Signed)
Cardiac Rehab Medication Review by a Pharmacist  Does the patient  feel that his/her medications are working for him/her?  yes  Has the patient been experiencing any side effects to the medications prescribed?  no  Does the patient measure his/her own blood pressure or blood glucose at home?  yes   Does the patient have any problems obtaining medications due to transportation or finances?   no  Understanding of regimen: fair Understanding of indications: fair Potential of compliance: fair  Pharmacist comments: Checks BP 2-3 times a day and takes isosorbide and/or losartan if BP is "high" (150/70s) which occurs roughly twice a week.   Harlow Mares, PharmD PGY1 Pharmacy Resident Phone [phone]  01/20/2018   5:53 PM

## 2018-01-22 NOTE — Progress Notes (Signed)
Andre Warren 72 y.o. male DOB 12-Mar-1945 MRN [id number]       Nutrition  No diagnosis found. Past Medical History:  Diagnosis Date  . CAD (coronary artery disease)   . CKD (chronic kidney disease) stage 2, GFR 60-89 ml/min   . H/O agent Orange exposure   . Hypercholesteremia   . Hypertension   . Peripheral arterial disease (HCC)    LCEA  . Subdural hematoma, post-traumatic (HCC) 03/2012   Saw Dr. Danielle Dess has been released.    Meds reviewed.     Current Outpatient Medications (Cardiovascular):  .  atorvastatin (LIPITOR) 80 MG tablet, Take 1 tablet (80 mg total) by mouth daily. .  isosorbide dinitrate (ISORDIL) 20 MG tablet, Take 20 mg by mouth daily as needed (high blood pressure). Marland Kitchen  losartan (COZAAR) 50 MG tablet, Take 25 mg by mouth daily as needed (high blood pressure). .  metoprolol tartrate (LOPRESSOR) 25 MG tablet, Take 1 tablet (25 mg total) by mouth 2 (two) times daily.   Current Outpatient Medications (Analgesics):  Marland Kitchen  BABY ASPIRIN PO, Take 81 mg by mouth daily.  Current Outpatient Medications (Hematological):  .  apixaban (ELIQUIS) 5 MG TABS tablet, Take 1 tablet (5 mg total) by mouth 2 (two) times daily. .  clopidogrel (PLAVIX) 75 MG tablet, Take 75 mg by mouth daily.    HT: Ht Readings from Last 1 Encounters:  09/03/17 5\' 7"  (1.702 m)    WT: Wt Readings from Last 5 Encounters:  09/06/17 160 lb 4.8 oz (72.7 kg)  08/12/16 170 lb (77.1 kg)  07/25/15 174 lb 3.2 oz (79 kg)  06/12/14 182 lb 12.8 oz (82.9 kg)  03/21/14 180 lb (81.6 kg)     BMI = 25.11    09/03/17  Current tobacco use? No       Labs:  Lipid Panel     Component Value Date/Time   CHOL 140 09/03/2017 1753   TRIG 73 09/03/2017 1753   HDL 39 (L) 09/03/2017 1753   CHOLHDL 3.6 09/03/2017 1753   VLDL 15 09/03/2017 1753   LDLCALC 86 09/03/2017 1753    Lab Results  Component Value Date   HGBA1C 5.4 09/03/2017   CBG (last 3)  No results for input(s): GLUCAP in the last 72  hours.  Nutrition Diagnosis ? Food-and nutrition-related knowledge deficit related to lack of exposure to information as related to diagnosis of: ? CVD ? CKD  Nutrition Goal(s):  ? To be determined  Plan:  Pt to attend nutrition classes ? Nutrition I ? Nutrition II ? Portion Distortion  Will provide client-centered nutrition education as part of interdisciplinary care.   Monitor and evaluate progress toward nutrition goal with team.  Ross Marcus, MS, RD, LDN 01/22/2018 9:09 AM

## 2018-01-26 ENCOUNTER — Encounter (HOSPITAL_COMMUNITY): Payer: Self-pay

## 2018-01-26 ENCOUNTER — Encounter (HOSPITAL_COMMUNITY)
Admission: RE | Admit: 2018-01-26 | Discharge: 2018-01-26 | Disposition: A | Discharge status: Home / Self Care | Payer: No Typology Code available for payment source | Source: Ambulatory Visit | Attending: Cardiology | Admitting: Cardiology

## 2018-01-26 VITALS — BP 110/60 | HR 63 | Ht 67.5 in | Wt 161.2 lb

## 2018-01-26 DIAGNOSIS — I739 Peripheral vascular disease, unspecified: Secondary | ICD-10-CM | POA: Insufficient documentation

## 2018-01-26 DIAGNOSIS — Z7901 Long term (current) use of anticoagulants: Secondary | ICD-10-CM | POA: Diagnosis not present

## 2018-01-26 DIAGNOSIS — Z7902 Long term (current) use of antithrombotics/antiplatelets: Secondary | ICD-10-CM | POA: Diagnosis not present

## 2018-01-26 DIAGNOSIS — Z79899 Other long term (current) drug therapy: Secondary | ICD-10-CM | POA: Diagnosis not present

## 2018-01-26 DIAGNOSIS — I2510 Atherosclerotic heart disease of native coronary artery without angina pectoris: Secondary | ICD-10-CM | POA: Diagnosis not present

## 2018-01-26 DIAGNOSIS — I129 Hypertensive chronic kidney disease with stage 1 through stage 4 chronic kidney disease, or unspecified chronic kidney disease: Secondary | ICD-10-CM | POA: Insufficient documentation

## 2018-01-26 DIAGNOSIS — Z7982 Long term (current) use of aspirin: Secondary | ICD-10-CM | POA: Diagnosis not present

## 2018-01-26 DIAGNOSIS — N182 Chronic kidney disease, stage 2 (mild): Secondary | ICD-10-CM | POA: Diagnosis not present

## 2018-01-26 DIAGNOSIS — I213 ST elevation (STEMI) myocardial infarction of unspecified site: Secondary | ICD-10-CM | POA: Insufficient documentation

## 2018-01-26 DIAGNOSIS — E7800 Pure hypercholesterolemia, unspecified: Secondary | ICD-10-CM | POA: Diagnosis not present

## 2018-01-26 HISTORY — DX: Occlusion and stenosis of unspecified carotid artery: I65.29

## 2018-01-26 NOTE — Progress Notes (Signed)
Cardiac Individual Treatment Plan  Patient Details  Name: Andre Warren MRN: [id number] Date of Birth: June 13, 1945 Referring Provider:     CARDIAC REHAB PHASE II ORIENTATION from 01/26/2018 in [facility] CARDIAC [facility]  Referring Provider  Dr Sarajane Marek, [place], Dr Armanda Magic, Covering      Initial Encounter Date:    CARDIAC REHAB PHASE II ORIENTATION from 01/26/2018 in [facility] CARDIAC REHAB  Date  01/26/18      Visit Diagnosis: ST elevation myocardial infarction (STEMI), unspecified artery (HCC) 09/03/17  Patient's Home Medications on Admission:  Current Outpatient Medications:  .  apixaban (ELIQUIS) 5 MG TABS tablet, Take 1 tablet (5 mg total) by mouth 2 (two) times daily., Disp: 60 tablet, Rfl: 0 .  atorvastatin (LIPITOR) 80 MG tablet, Take 1 tablet (80 mg total) by mouth daily., Disp: 30 tablet, Rfl: 6 .  BABY ASPIRIN PO, Take 81 mg by mouth daily., Disp: , Rfl:  .  clopidogrel (PLAVIX) 75 MG tablet, Take 75 mg by mouth daily., Disp: , Rfl:  .  isosorbide dinitrate (ISORDIL) 20 MG tablet, Take 20 mg by mouth daily as needed (high blood pressure)., Disp: , Rfl:  .  losartan (COZAAR) 50 MG tablet, Take 25 mg by mouth daily as needed (high blood pressure)., Disp: , Rfl:  .  metoprolol tartrate (LOPRESSOR) 25 MG tablet, Take 1 tablet (25 mg total) by mouth 2 (two) times daily., Disp: 60 tablet, Rfl: 6  Past Medical History: Past Medical History:  Diagnosis Date  . CAD (coronary artery disease)   . Carotid artery occlusion   . CKD (chronic kidney disease) stage 2, GFR 60-89 ml/min   . H/O agent Orange exposure   . Hypercholesteremia   . Hypertension   . Peripheral arterial disease (HCC)    LCEA  . Subdural hematoma, post-traumatic (HCC) 03/2012   Saw Dr. Danielle Dess has been released.     Tobacco Use: Social History   Tobacco Use  Smoking Status Never Smoker  Smokeless Tobacco Never Used    Labs: Recent Review Flowsheet Data     Labs for ITP Cardiac and Pulmonary Rehab Latest Ref Rng & Units 02/22/2013 11/15/2013 03/21/2014 06/12/2014 09/03/2017   Cholestrol 0 - 200 mg/dL 409 811 [phone]   LDLCALC 0 - 99 mg/dL 80 70 83 69 86   HDL >21 mg/dL 48 41 30(Q) 40 65(H)   Trlycerides <150 mg/dL 846 98 99 68 73   Hemoglobin A1c 4.8 - 5.6 % - 5.7(H) 5.7(H) 5.8(H) 5.4   TCO2 22 - 32 mmol/L - - - - 21(L)      Capillary Blood Glucose: Lab Results  Component Value Date   GLUCAP 94 03/14/2012     Exercise Target Goals: Exercise Program Goal: Individual exercise prescription set using results from initial 6 min walk test and THRR while considering  patient's activity barriers and safety.   Exercise Prescription Goal: Initial exercise prescription builds to 30-45 minutes a day of aerobic activity, 2-3 days per week.  Home exercise guidelines will be given to patient during program as part of exercise prescription that the participant will acknowledge.  Activity Barriers & Risk Stratification: Activity Barriers & Cardiac Risk Stratification - 01/26/18 1040      Activity Barriers & Cardiac Risk Stratification   Activity Barriers  Neck/Spine Problems    Cardiac Risk Stratification  High       6 Minute Walk: 6 Minute Walk    Row Name  01/26/18 1039         6 Minute Walk   Phase  Initial     Distance  2000 feet     Walk Time  6 minutes     # of Rest Breaks  0     MPH  3.79     METS  4.21     RPE  7     VO2 Peak  14.73     Symptoms  No     Resting HR  63 bpm     Resting BP  110/60     Resting Oxygen Saturation   99 %     Exercise Oxygen Saturation  during 6 min walk  99 %     Max Ex. HR  93 bpm     Max Ex. BP  138/72     2 Minute Post BP  110/62        Oxygen Initial Assessment:   Oxygen Re-Evaluation:   Oxygen Discharge (Final Oxygen Re-Evaluation):   Initial Exercise Prescription: Initial Exercise Prescription - 01/26/18 1000      Date of Initial Exercise RX and Referring Provider   Date   01/26/18    Referring Provider  Dr Sarajane Marek, [place], Dr Armanda Magic, Covering    Expected Discharge Date  05/07/18      Treadmill   MPH  4    Grade  1    Minutes  10      Bike   Level  1.2    Minutes  10    METs  4      Rower   Level  3    Minutes  10      Prescription Details   Frequency (times per week)  3    Duration  Progress to 30 minutes of continuous aerobic without signs/symptoms of physical distress      Intensity   THRR 40-80% of Max Heartrate  58-119    Ratings of Perceived Exertion  11-13      Progression   Progression  Continue to progress workloads to maintain intensity without signs/symptoms of physical distress.      Resistance Training   Training Prescription  Yes    Weight  5lbs.     Reps  10-15       Perform Capillary Blood Glucose checks as needed.  Exercise Prescription Changes:   Exercise Comments:   Exercise Goals and Review: Exercise Goals    Row Name 01/26/18 1041             Exercise Goals   Increase Physical Activity  Yes       Intervention  Provide advice, education, support and counseling about physical activity/exercise needs.;Develop an individualized exercise prescription for aerobic and resistive training based on initial evaluation findings, risk stratification, comorbidities and participant's personal goals.       Expected Outcomes  Short Term: Attend rehab on a regular basis to increase amount of physical activity.       Increase Strength and Stamina  Yes       Intervention  Provide advice, education, support and counseling about physical activity/exercise needs.;Develop an individualized exercise prescription for aerobic and resistive training based on initial evaluation findings, risk stratification, comorbidities and participant's personal goals.       Expected Outcomes  Short Term: Increase workloads from initial exercise prescription for resistance, speed, and METs.       Able to understand and use rate of  perceived  exertion (RPE) scale  Yes       Intervention  Provide education and explanation on how to use RPE scale       Expected Outcomes  Short Term: Able to use RPE daily in rehab to express subjective intensity level;Long Term:  Able to use RPE to guide intensity level when exercising independently       Knowledge and understanding of Target Heart Rate Range (THRR)  Yes       Intervention  Provide education and explanation of THRR including how the numbers were predicted and where they are located for reference       Expected Outcomes  Short Term: Able to state/look up THRR;Long Term: Able to use THRR to govern intensity when exercising independently;Short Term: Able to use daily as guideline for intensity in rehab       Able to check pulse independently  Yes       Intervention  Provide education and demonstration on how to check pulse in carotid and radial arteries.;Review the importance of being able to check your own pulse for safety during independent exercise       Expected Outcomes  Short Term: Able to explain why pulse checking is important during independent exercise;Long Term: Able to check pulse independently and accurately       Understanding of Exercise Prescription  Yes       Intervention  Provide education, explanation, and written materials on patient's individual exercise prescription       Expected Outcomes  Short Term: Able to explain program exercise prescription;Long Term: Able to explain home exercise prescription to exercise independently          Exercise Goals Re-Evaluation :   Discharge Exercise Prescription (Final Exercise Prescription Changes):   Nutrition:  Target Goals: Understanding of nutrition guidelines, daily intake of sodium 1500mg , cholesterol 200mg , calories 30% from fat and 7% or less from saturated fats, daily to have 5 or more servings of fruits and vegetables.  Biometrics: Pre Biometrics - 01/26/18 1041      Pre Biometrics   Height  5' 7.5"  (1.715 m)    Weight  73.1 kg    Waist Circumference  33.5 inches    Hip Circumference  39.5 inches    Waist to Hip Ratio  0.85 %    BMI (Calculated)  24.85    Triceps Skinfold  17 mm    % Body Fat  24.1 %    Grip Strength  36 kg    Flexibility  16 in    Single Leg Stand  2.63 seconds        Nutrition Therapy Plan and Nutrition Goals:   Nutrition Assessments:   Nutrition Goals Re-Evaluation:   Nutrition Goals Re-Evaluation:   Nutrition Goals Discharge (Final Nutrition Goals Re-Evaluation):   Psychosocial: Target Goals: Acknowledge presence or absence of significant depression and/or stress, maximize coping skills, provide positive support system. Participant is able to verbalize types and ability to use techniques and skills needed for reducing stress and depression.  Initial Review & Psychosocial Screening: Initial Psych Review & Screening - 01/26/18 1618      Initial Review   Current issues with  None Identified      Family Dynamics   Good Support System?  Yes   Andre Warren has his wife and children for support     Barriers   Psychosocial barriers to participate in program  There are no identifiable barriers or psychosocial needs.  Screening Interventions   Interventions  Encouraged to exercise       Quality of Life Scores: Quality of Life - 01/26/18 1035      Quality of Life   Select  Quality of Life      Quality of Life Scores   Health/Function Pre  18.37 %    Socioeconomic Pre  22.75 %    Psych/Spiritual Pre  23.29 %    Family Pre  27.6 %    GLOBAL Pre  21.67 %      Scores of 19 and below usually indicate a poorer quality of life in these areas.  A difference of  2-3 points is a clinically meaningful difference.  A difference of 2-3 points in the total score of the Quality of Life Index has been associated with significant improvement in overall quality of life, self-image, physical symptoms, and general health in studies assessing change in  quality of life.  PHQ-9: Recent Review Flowsheet Data    Depression screen [facility] 2/9 07/28/2015 03/21/2014 11/15/2013   Decreased Interest 0 0 0   Down, Depressed, Hopeless 0 0 0   PHQ - 2 Score 0 0 0     Interpretation of Total Score  Total Score Depression Severity:  1-4 = Minimal depression, 5-9 = Mild depression, 10-14 = Moderate depression, 15-19 = Moderately severe depression, 20-27 = Severe depression   Psychosocial Evaluation and Intervention:   Psychosocial Re-Evaluation:   Psychosocial Discharge (Final Psychosocial Re-Evaluation):   Vocational Rehabilitation: Provide vocational rehab assistance to qualifying candidates.   Vocational Rehab Evaluation & Intervention: Vocational Rehab - 01/26/18 1622      Initial Vocational Rehab Evaluation & Intervention   Assessment shows need for Vocational Rehabilitation  No   Andre Warren does not need vocational rehab at this time      Education: Education Goals: Education classes will be provided on a weekly basis, covering required topics. Participant will state understanding/return demonstration of topics presented.  Learning Barriers/Preferences: Learning Barriers/Preferences - 01/26/18 1043      Learning Barriers/Preferences   Learning Barriers  Sight    Learning Preferences  Skilled Demonstration;Individual Instruction       Education Topics: Count Your Pulse:  -Group instruction provided by verbal instruction, demonstration, patient participation and written materials to support subject.  Instructors address importance of being able to find your pulse and how to count your pulse when at home without a heart monitor.  Patients get hands on experience counting their pulse with staff help and individually.   Heart Attack, Angina, and Risk Factor Modification:  -Group instruction provided by verbal instruction, video, and written materials to support subject.  Instructors address signs and symptoms of angina and heart  attacks.    Also discuss risk factors for heart disease and how to make changes to improve heart health risk factors.   Functional Fitness:  -Group instruction provided by verbal instruction, demonstration, patient participation, and written materials to support subject.  Instructors address safety measures for doing things around the house.  Discuss how to get up and down off the floor, how to pick things up properly, how to safely get out of a chair without assistance, and balance training.   Meditation and Mindfulness:  -Group instruction provided by verbal instruction, patient participation, and written materials to support subject.  Instructor addresses importance of mindfulness and meditation practice to help reduce stress and improve awareness.  Instructor also leads participants through a meditation exercise.    Stretching for Flexibility  and Mobility:  -Group instruction provided by verbal instruction, patient participation, and written materials to support subject.  Instructors lead participants through series of stretches that are designed to increase flexibility thus improving mobility.  These stretches are additional exercise for major muscle groups that are typically performed during regular warm up and cool down.   Hands Only CPR:  -Group verbal, video, and participation provides a basic overview of AHA guidelines for community CPR. Role-play of emergencies allow participants the opportunity to practice calling for help and chest compression technique with discussion of AED use.   Hypertension: -Group verbal and written instruction that provides a basic overview of hypertension including the most recent diagnostic guidelines, risk factor reduction with self-care instructions and medication management.    Nutrition I class: Heart Healthy Eating:  -Group instruction provided by PowerPoint slides, verbal discussion, and written materials to support subject matter. The instructor  gives an explanation and review of the Therapeutic Lifestyle Changes diet recommendations, which includes a discussion on lipid goals, dietary fat, sodium, fiber, plant stanol/sterol esters, sugar, and the components of a well-balanced, healthy diet.   Nutrition II class: Lifestyle Skills:  -Group instruction provided by PowerPoint slides, verbal discussion, and written materials to support subject matter. The instructor gives an explanation and review of label reading, grocery shopping for heart health, heart healthy recipe modifications, and ways to make healthier choices when eating out.   Diabetes Question & Answer:  -Group instruction provided by PowerPoint slides, verbal discussion, and written materials to support subject matter. The instructor gives an explanation and review of diabetes co-morbidities, pre- and post-prandial blood glucose goals, pre-exercise blood glucose goals, signs, symptoms, and treatment of hypoglycemia and hyperglycemia, and foot care basics.   Diabetes Blitz:  -Group instruction provided by PowerPoint slides, verbal discussion, and written materials to support subject matter. The instructor gives an explanation and review of the physiology behind type 1 and type 2 diabetes, diabetes medications and rational behind using different medications, pre- and post-prandial blood glucose recommendations and Hemoglobin A1c goals, diabetes diet, and exercise including blood glucose guidelines for exercising safely.    Portion Distortion:  -Group instruction provided by PowerPoint slides, verbal discussion, written materials, and food models to support subject matter. The instructor gives an explanation of serving size versus portion size, changes in portions sizes over the last 20 years, and what consists of a serving from each food group.   Stress Management:  -Group instruction provided by verbal instruction, video, and written materials to support subject matter.   Instructors review role of stress in heart disease and how to cope with stress positively.     Exercising on Your Own:  -Group instruction provided by verbal instruction, power point, and written materials to support subject.  Instructors discuss benefits of exercise, components of exercise, frequency and intensity of exercise, and end points for exercise.  Also discuss use of nitroglycerin and activating EMS.  Review options of places to exercise outside of rehab.  Review guidelines for sex with heart disease.   Cardiac Drugs I:  -Group instruction provided by verbal instruction and written materials to support subject.  Instructor reviews cardiac drug classes: antiplatelets, anticoagulants, beta blockers, and statins.  Instructor discusses reasons, side effects, and lifestyle considerations for each drug class.   Cardiac Drugs II:  -Group instruction provided by verbal instruction and written materials to support subject.  Instructor reviews cardiac drug classes: angiotensin converting enzyme inhibitors (ACE-I), angiotensin II receptor blockers (ARBs), nitrates, and calcium channel  blockers.  Instructor discusses reasons, side effects, and lifestyle considerations for each drug class.   Anatomy and Physiology of the Circulatory System:  Group verbal and written instruction and models provide basic cardiac anatomy and physiology, with the coronary electrical and arterial systems. Review of: AMI, Angina, Valve disease, Heart Failure, Peripheral Artery Disease, Cardiac Arrhythmia, Pacemakers, and the ICD.   Other Education:  -Group or individual verbal, written, or video instructions that support the educational goals of the cardiac rehab program.   Holiday Eating Survival Tips:  -Group instruction provided by PowerPoint slides, verbal discussion, and written materials to support subject matter. The instructor gives patients tips, tricks, and techniques to help them not only survive but enjoy  the holidays despite the onslaught of food that accompanies the holidays.   Knowledge Questionnaire Score: Knowledge Questionnaire Score - 01/26/18 1031      Knowledge Questionnaire Score   Pre Score  21/24       Core Components/Risk Factors/Patient Goals at Admission: Personal Goals and Risk Factors at Admission - 01/26/18 1035      Core Components/Risk Factors/Patient Goals on Admission    Weight Management  Yes;Weight Maintenance    Intervention  Weight Management: Develop a combined nutrition and exercise program designed to reach desired caloric intake, while maintaining appropriate intake of nutrient and fiber, sodium and fats, and appropriate energy expenditure required for the weight goal.;Weight Management: Provide education and appropriate resources to help participant work on and attain dietary goals.;Weight Management/Obesity: Establish reasonable short term and long term weight goals.    Admit Weight  161 lb 2.5 oz (73.1 kg)    Expected Outcomes  Short Term: Continue to assess and modify interventions until short term weight is achieved;Long Term: Adherence to nutrition and physical activity/exercise program aimed toward attainment of established weight goal;Weight Maintenance: Understanding of the daily nutrition guidelines, which includes 25-35% calories from fat, 7% or less cal from saturated fats, less than 200mg  cholesterol, less than 1.5gm of sodium, & 5 or more servings of fruits and vegetables daily;Understanding recommendations for meals to include 15-35% energy as protein, 25-35% energy from fat, 35-60% energy from carbohydrates, less than 200mg  of dietary cholesterol, 20-35 gm of total fiber daily;Understanding of distribution of calorie intake throughout the day with the consumption of 4-5 meals/snacks    Hypertension  Yes    Intervention  Provide education on lifestyle modifcations including regular physical activity/exercise, weight management, moderate sodium  restriction and increased consumption of fresh fruit, vegetables, and low fat dairy, alcohol moderation, and smoking cessation.;Monitor prescription use compliance.    Expected Outcomes  Short Term: Continued assessment and intervention until BP is < 140/53mm HG in hypertensive participants. < 130/46mm HG in hypertensive participants with diabetes, heart failure or chronic kidney disease.;Long Term: Maintenance of blood pressure at goal levels.    Lipids  Yes    Intervention  Provide education and support for participant on nutrition & aerobic/resistive exercise along with prescribed medications to achieve LDL 70mg , HDL >40mg .    Expected Outcomes  Long Term: Cholesterol controlled with medications as prescribed, with individualized exercise RX and with personalized nutrition plan. Value goals: LDL < 70mg , HDL > 40 mg.;Short Term: Participant states understanding of desired cholesterol values and is compliant with medications prescribed. Participant is following exercise prescription and nutrition guidelines.    Stress  Yes    Intervention  Offer individual and/or small group education and counseling on adjustment to heart disease, stress management and health-related lifestyle change. Teach and  support self-help strategies.;Refer participants experiencing significant psychosocial distress to appropriate mental health specialists for further evaluation and treatment. When possible, include family members and significant others in education/counseling sessions.    Expected Outcomes  Short Term: Participant demonstrates changes in health-related behavior, relaxation and other stress management skills, ability to obtain effective social support, and compliance with psychotropic medications if prescribed.;Long Term: Emotional wellbeing is indicated by absence of clinically significant psychosocial distress or social isolation.       Core Components/Risk Factors/Patient Goals Review:    Core  Components/Risk Factors/Patient Goals at Discharge (Final Review):    ITP Comments: ITP Comments    Row Name 01/26/18 1339 01/26/18 1617         ITP Comments  Dr. Armanda Magic  Dr. Armanda Magic, Medical Director         Comments: Andre Warren attended orientation from [phone] to [phone] to review rules and guidelines for program. Completed 6 minute walk test, Intitial ITP, and exercise prescription.  VSS. Telemetry-Sinus Rhtyhm.  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

## 2018-02-01 ENCOUNTER — Ambulatory Visit (HOSPITAL_COMMUNITY): Payer: Medicare Other

## 2018-02-03 ENCOUNTER — Ambulatory Visit (HOSPITAL_COMMUNITY): Payer: Medicare Other

## 2018-02-05 ENCOUNTER — Ambulatory Visit (HOSPITAL_COMMUNITY): Payer: Medicare Other

## 2018-02-08 ENCOUNTER — Ambulatory Visit (HOSPITAL_COMMUNITY): Payer: Medicare Other

## 2018-02-10 ENCOUNTER — Ambulatory Visit (HOSPITAL_COMMUNITY): Payer: Medicare Other

## 2018-02-11 ENCOUNTER — Telehealth (HOSPITAL_COMMUNITY): Payer: Self-pay | Admitting: *Deleted

## 2018-02-12 ENCOUNTER — Ambulatory Visit (HOSPITAL_COMMUNITY): Payer: Medicare Other

## 2018-02-15 ENCOUNTER — Ambulatory Visit (HOSPITAL_COMMUNITY): Payer: Medicare Other

## 2018-02-17 ENCOUNTER — Ambulatory Visit (HOSPITAL_COMMUNITY): Payer: Medicare Other

## 2018-02-19 ENCOUNTER — Ambulatory Visit (HOSPITAL_COMMUNITY): Payer: Medicare Other

## 2018-02-22 ENCOUNTER — Ambulatory Visit (HOSPITAL_COMMUNITY): Payer: Medicare Other

## 2018-02-24 ENCOUNTER — Ambulatory Visit (HOSPITAL_COMMUNITY): Payer: Medicare Other

## 2018-02-26 ENCOUNTER — Ambulatory Visit (HOSPITAL_COMMUNITY): Payer: Medicare Other

## 2018-03-01 ENCOUNTER — Ambulatory Visit (HOSPITAL_COMMUNITY): Payer: Medicare Other

## 2018-03-05 ENCOUNTER — Ambulatory Visit (HOSPITAL_COMMUNITY): Payer: Medicare Other

## 2018-03-08 ENCOUNTER — Ambulatory Visit (HOSPITAL_COMMUNITY): Payer: Medicare Other

## 2018-03-12 ENCOUNTER — Ambulatory Visit (HOSPITAL_COMMUNITY): Payer: Medicare Other

## 2018-03-15 ENCOUNTER — Ambulatory Visit (HOSPITAL_COMMUNITY): Payer: Medicare Other

## 2018-03-17 ENCOUNTER — Ambulatory Visit (HOSPITAL_COMMUNITY): Payer: Medicare Other

## 2018-03-19 ENCOUNTER — Ambulatory Visit (HOSPITAL_COMMUNITY): Payer: Medicare Other

## 2018-03-22 ENCOUNTER — Ambulatory Visit (HOSPITAL_COMMUNITY): Payer: Medicare Other

## 2018-03-24 ENCOUNTER — Ambulatory Visit (HOSPITAL_COMMUNITY): Payer: Medicare Other

## 2018-03-26 ENCOUNTER — Ambulatory Visit (HOSPITAL_COMMUNITY): Payer: Medicare Other

## 2018-03-29 ENCOUNTER — Ambulatory Visit (HOSPITAL_COMMUNITY): Payer: Medicare Other

## 2018-03-31 ENCOUNTER — Ambulatory Visit (HOSPITAL_COMMUNITY): Payer: Medicare Other

## 2018-04-02 ENCOUNTER — Ambulatory Visit (HOSPITAL_COMMUNITY): Payer: Medicare Other

## 2018-04-05 ENCOUNTER — Ambulatory Visit (HOSPITAL_COMMUNITY): Payer: Medicare Other

## 2018-04-07 ENCOUNTER — Ambulatory Visit (HOSPITAL_COMMUNITY): Payer: Medicare Other

## 2018-04-09 ENCOUNTER — Ambulatory Visit (HOSPITAL_COMMUNITY): Payer: Medicare Other

## 2018-04-12 ENCOUNTER — Ambulatory Visit (HOSPITAL_COMMUNITY): Payer: Medicare Other

## 2018-04-14 ENCOUNTER — Ambulatory Visit (HOSPITAL_COMMUNITY): Payer: Medicare Other

## 2018-04-16 ENCOUNTER — Ambulatory Visit (HOSPITAL_COMMUNITY): Payer: Medicare Other

## 2018-04-19 ENCOUNTER — Ambulatory Visit (HOSPITAL_COMMUNITY): Payer: Medicare Other

## 2018-04-21 ENCOUNTER — Ambulatory Visit (HOSPITAL_COMMUNITY): Payer: Medicare Other

## 2018-04-23 ENCOUNTER — Ambulatory Visit (HOSPITAL_COMMUNITY): Payer: Medicare Other

## 2018-04-26 ENCOUNTER — Ambulatory Visit (HOSPITAL_COMMUNITY): Payer: Medicare Other

## 2018-04-28 ENCOUNTER — Ambulatory Visit (HOSPITAL_COMMUNITY): Payer: Medicare Other

## 2018-04-30 ENCOUNTER — Ambulatory Visit (HOSPITAL_COMMUNITY): Payer: Medicare Other

## 2018-05-03 ENCOUNTER — Ambulatory Visit (HOSPITAL_COMMUNITY): Payer: Medicare Other

## 2018-05-05 ENCOUNTER — Ambulatory Visit (HOSPITAL_COMMUNITY): Payer: Medicare Other

## 2019-05-25 NOTE — Progress Notes (Signed)
Cardiology Office Note:   Date:  05/27/2019  NAME:  Andre Warren    MRN: [id number] DOB:  1945/04/06   PCP:  [name], MD  Cardiologist:  No primary care provider on file.   Referring MD: [name], MD   Chief Complaint  Patient presents with  . Shortness of Breath   History of Present Illness:   Andre Warren is a 74 y.o. male with a hx of CAD, HTN, Afib, CKD who is being seen today for the evaluation of shortness of breath at the request of [name], MD.  [name] has an extensive history of CAD as detailed below.  He has had shortness of breath for what appears to be 1 year.  He reports when he does any heavy exertion he gets profoundly short of breath.  He reports tightness in his chest that occurs due to heavy breathing.  He also reports symptoms of lower extremity edema.  He underwent an echocardiogram at the [place] in [place] on 10/06/2018 demonstrated normal left ventricular function 55-60% with grade 3 diastolic dysfunction.  He was also noted to have mild aortic stenosis.  He was started on Lasix with minimal improvement in symptoms.  He underwent a recent exercise treadmill stress test on 05/03/2019 at the [facility].  He exercised for 3 minutes 40 seconds and had a drop in blood pressure from 135/76 down to 101/60.  There was no evidence of perfusion defect on nuclear medicine imaging.  He was switched to a Lexiscan stress test.  There was no evidence of 3 times daily either.  Due to persistent symptoms of shortness of breath as well as drop in blood pressure he was referred to [place] for cardiac catheterization by his cardiologist the [place].  He sees [name].  Recent chest x-ray demonstrates no acute cardiopulmonary pathology.  Interestingly, on the echocardiogram from 09/2018 he had an elevated RVSP around 70 mmHg.  He also had mild aortic stenosis with a peak aortic velocity of 2.7 m/s and mean gradient of 15.  His EKG today demonstrates normal  sinus rhythm with heart rate 65.  He has frequent PVCs, trigeminy noted which appear to be coming from the left ventricle.  He reports that he has not had this evaluated the [place].  His symptoms of shortness of breath appear to occur daily.  Worse with bending forward.  Worsened with exercise.  Occur daily.  Appear to not be improving despite diuretic therapy.  Given the abnormal cardiac stress test which was mainly the exercise component [facility] left heart catheter[facility] performed.  Problem List 1. CAD -CABG 2004 (LIMA-OM1, RIMA-LAD, SVG-PDA) -inferior STEMI 08/2017, occluded SVG-PDA managed medically 2. Atrial fibrillation -in setting of ACS 08/2017 on eliquis  3. CKD 3 4. HTN 5. PAD -R CEA -moderate R renal artery stenosis 70%  Past Medical History: Past Medical History:  Diagnosis Date  . CAD (coronary artery disease)   . Carotid artery occlusion   . CKD (chronic kidney disease) stage 2, GFR 60-89 ml/min   . H/O agent Orange exposure   . Hypercholesteremia   . Hypertension   . Peripheral arterial disease (HCC)    LCEA  . Subdural hematoma, post-traumatic (HCC) 03/2012   Saw Dr. Danielle Dess has been released.     Past Surgical History: Past Surgical History:  Procedure Laterality Date  . CAROTID ENDARTERECTOMY  2004  . CORONARY ANGIOPLASTY WITH STENT PLACEMENT  Became occluded and resulted in CABG  . CORONARY ARTERY BYPASS GRAFT     x 3  . LEFT HEART CATH AND CORS/GRAFTS ANGIOGRAPHY N/A 09/03/2017   Procedure: LEFT HEART CATH AND CORS/GRAFTS ANGIOGRAPHY;  Surgeon: Kathleene Hazel, MD;  Location: [facility]IVE [facility];  Service: Cardiovascular;  Laterality: N/A;    Current Medications: Current Meds  Medication Sig  . amLODipine (NORVASC) 10 MG tablet Take 10 mg by mouth daily.  . furosemide (LASIX) 20 MG tablet Take 20 mg by mouth.  . pantoprazole (PROTONIX) 40 MG tablet Take 40 mg by mouth daily.     Allergies:    Latex     Social History: Social History   Socioeconomic History  . Marital status: Married    Spouse name: Not on file  . Number of children: Not on file  . Years of education: Not on file  . Highest education level: Not on file  Occupational History  . Occupation: [employer]  Tobacco Use  . Smoking status: Never Smoker  . Smokeless tobacco: Never Used  Substance and Sexual Activity  . Alcohol use: No  . Drug use: No  . Sexual activity: Yes    Birth control/protection: None  Other Topics Concern  . Not on file  Social History Narrative  . Not on file   Social Determinants of Health   Financial Resource Strain:   . Difficulty of Paying Living Expenses:   Food Insecurity:   . Worried About Programme researcher, broadcasting/film/video in the Last Year:   . Barista in the Last Year:   Transportation Needs:   . Freight forwarder (Medical):   Marland Kitchen Lack of Transportation (Non-Medical):   Physical Activity:   . Days of Exercise per Week:   . Minutes of Exercise per Session:   Stress:   . Feeling of Stress :   Social Connections:   . Frequency of Communication with Friends and Family:   . Frequency of Social Gatherings with Friends and Family:   . Attends Religious Services:   . Active Member of Clubs or Organizations:   . Attends Banker Meetings:   Marland Kitchen Marital Status:      Family History: The patient's family history includes Cancer in his father and sister; Heart disease in his father, mother, and sister; Hypertension in his father and mother; Stroke in his father.  ROS:   All other ROS reviewed and negative. Pertinent positives noted in the HPI.     EKGs/Labs/Other Studies Reviewed:   The following studies were personally reviewed by me today:  EKG:  EKG is ordered today.  The ekg ordered today demonstrates normal sinus rhythm with no acute ischemic changes, ventricular trigeminy with PVCs likely originating from the LV, and was personally reviewed by me.   LHC 08/2017 1.  Triple vessel CAD s/p 3V CABG with 2/3 patent bypass grafts 2. Acute MI felt to be due to the acute occlusion of the SVG to the PDA. The RCA is chronically occluded in the mid segment, unchanged from pre-bypass cath films. The PDA is noted to be small and diffusely diseased on the operative report from 2004. The cath films from 2004 are reviewed and confirm a small PDA which was likely the target of his occluded vein graft. This vein graft appears to be heavily thrombotic. The PDA and posterolateral artery is seen to fill from left to right collaterals from the LAD.  3. The LAD is occluded in  the mid segment. The RIMA graft is patent and fills the mid and distal LAD 4. The Circumflex is occluded leading into a large obtuse marginal branch. The LIMA graft to the OM is patent.  5. Inferior wall hypokinesis with overall preserved LV systolic function  TTE 08/2017 - Left ventricle: The cavity size was normal. Wall thickness was  normal. Systolic function was normal. The estimated ejection  fraction was in the range of 55% to 60%. Mild hypokinesis of the  basalinferior myocardium.  - Mitral valve: There was mild to moderate regurgitation directed  posteriorly.  - Left atrium: The atrium was mildly dilated.  - Right ventricle: The cavity size was mildly dilated.  - Right atrium: The atrium was mildly dilated.  - Atrial septum: No defect or patent foramen ovale was identified.   Recent Labs: No results found for requested labs within last 8760 hours.   Recent Lipid Panel    Component Value Date/Time   CHOL 140 09/03/2017 1753   TRIG 73 09/03/2017 1753   HDL 39 (L) 09/03/2017 1753   CHOLHDL 3.6 09/03/2017 1753   VLDL 15 09/03/2017 1753   LDLCALC 86 09/03/2017 1753    Physical Exam:   VS:  BP 120/62   Pulse 65   Temp (!) 97 F (36.1 C)   Ht 5\' 8"  (1.727 m)   Wt 164 lb 6.4 oz (74.6 kg)   SpO2 97%   BMI 25.00 kg/m    Wt Readings from Last 3 Encounters:  05/27/19 164 lb 6.4 oz  (74.6 kg)  01/26/18 161 lb 2.5 oz (73.1 kg)  09/06/17 160 lb 4.8 oz (72.7 kg)    General: Well nourished, well developed, in no acute distress Heart: Atraumatic, normal size  Eyes: PEERLA, EOMI  Neck: Supple, no JVD Endocrine: No thryomegaly Cardiac: Normal S1, S2; RRR; no murmurs, rubs, or gallops Lungs: Clear to auscultation bilaterally, no wheezing, rhonchi or rales  Abd: Soft, nontender, no hepatomegaly  Ext: 1+ lower extremity edema Musculoskeletal: No deformities, BUE and BLE strength normal and equal Skin: Warm and dry, no rashes   Neuro: Alert and oriented to person, place, time, and situation, CNII-XII grossly intact, no focal deficits  Psych: Normal mood and affect   ASSESSMENT:   VONTAE KAPLOWITZ is a 74 y.o. male who presents for the following: 1. SOB (shortness of breath) on exertion   2. PVC (premature ventricular contraction)   3. Chronic diastolic heart failure (HCC)   4. Abnormal stress test   5. Coronary artery disease involving coronary bypass graft of native heart without angina pectoris   6. Essential hypertension   7. PAD (peripheral artery disease) (HCC)   8. Persistent atrial fibrillation (HCC)     PLAN:   1. SOB (shortness of breath) on exertion 2. PVC (premature ventricular contraction) 3. Chronic diastolic heart failure (HCC) 4. Abnormal stress test 5. Coronary artery disease involving coronary bypass graft of native heart without angina pectoris -History of CABG in 2004.  Most recent left heart catheterization in 2019 at [facility] shows patent LIMA to OM1, patent RIMA to LAD, occluded SVG to PDA.  He had a STEMI which was related to occluded SVG to PDA.  This was managed medically. -He describes symptoms of 1 year of progressive shortness of breath which appear to be heart failure related.  An echocardiogram last year showed grade 3 diastolic function and normal LV function.  He did have an RVSP around 70. -[facility] for 3 minutes 40 seconds and had a drop in systolic blood pressure from 135 down to 101 that was associated with symptoms.  He had normal perfusion on a subsequent Lexiscan.  His cardiologist referred him here today for left and right heart catheterization which we will proceed with.  He will have this done on Monday with Dr. Jonathan Barry.  He will hold his Eliquis starting today.  We will obtain BMP CBC as well as a BNP today in anticipation for his cardiac catheterization. -Overall have more suspicion this is related to worsening heart failure which needs to be evaluated.  We will proceed with an echocardiogram as well.  He did not take his Lasix today and have instructed him to do that today.  He will resume 40 mg daily. -He has been maintained on aspirin and he will continue that.  He will also continue his Lipitor. -He is also having frequent PVCs which could be a manifestation of decompensated heart failure.  Not grossly volume overloaded on exam today but does have lower extremity edema.  We will get a better idea of heart function on echocardiogram.  He may need a monitor to determine his PVC burden.  For now we will start with a left and right heart catheterization as well as echocardiogram.  He also obtain labs today.  6. Essential hypertension -Stable today.  He will continue his metoprolol tartrate 25 mg twice daily.  He was taken off losartan and we will continue to hold that.  He will continue his home amlodipine.  7. PAD (peripheral artery disease) (HCC) -Status post right CEA.  Also has right renal artery stenosis.  He is not a smoker.  We will continue with aspirin as well as cholesterol medications for now.  8. Atrial fibrillation -History of atrial fibrillation.  [facility]  In normal sinus rhythm today.  He will hold his Eliquis starting today in anticipation for his cardiac catheterization on Monday.  I will see him  back in 3 weeks after the above testing.  Disposition: Return in about 3 weeks (around 06/17/2019).  Medication Adjustments/Labs and Tests Ordered: Current medicines are reviewed at length with the patient today.  Concerns regarding medicines are outlined above.  Orders Placed This Encounter  Procedures  . Basic metabolic panel  . Brain natriuretic peptide  . CBC  . EKG 12-Lead  . ECHOCARDIOGRAM COMPLETE   No orders of the defined types were placed in this encounter.   Patient Instructions  Medication Instructions:  Hold Eliquis 2 days prior to CATH on 03/22 *If you need a refill on your cardiac medications before your next appointment, please call your pharmacy*   Lab Work: BMET, BNOP, CBC today   If you have labs (blood work) drawn today and your tests are completely normal, you will receive your results only by: . MyChart Message (if you have MyChart) OR . A paper copy in the mail If you have any lab test that is abnormal or we need to change your treatment, we will call you to review the results.   Testing/Procedures: Echocardiogram - Your physician has requested that you have an echocardiogram. Echocardiography is a painless test that uses sound waves to create images of your heart. It provides your doctor with information about the size and shape of your heart and how well your heart's chambers and valves are working. This procedure takes approximately   one hour. There are no restrictions for this procedure. This will be performed at our [facility] location - [address], [address].  Your physician has requested that you have a cardiac catheterization. Cardiac catheterization is used to diagnose and/or treat various heart conditions. Doctors may recommend this procedure for a number of different reasons. The most common reason is to evaluate chest pain. Chest pain can be a symptom of coronary artery disease (CAD), and cardiac catheterization can show whether plaque is  narrowing or blocking your heart's arteries. This procedure is also used to evaluate the valves, as well as measure the blood flow and oxygen levels in different parts of your heart. For further information please visit [email or web address] Please follow instruction sheet, as given.  Follow-Up: At [facility], you and your health needs are our priority.  As part of our continuing mission to provide you with exceptional heart care, we have created designated Provider Care Teams.  These Care Teams include your primary Cardiologist (physician) and Advanced Practice Providers (APPs -  Physician Assistants and Nurse Practitioners) who all work together to provide you with the care you need, when you need it.  We recommend signing up for the patient portal called "MyChart".  Sign up information is provided on this After Visit Summary.  MyChart is used to connect with patients for Virtual Visits (Telemedicine).  Patients are able to view lab/test results, encounter notes, upcoming appointments, etc.  Non-urgent messages can be sent to your provider as well.   To learn more about what you can do with MyChart, go to [email or web address].    Your next appointment:   3 week(s)  The format for your next appointment:   In Person  Provider:   Lennie Odor, MD   Other Instructions     [facility] CARDIOVASCULAR DIVISION [facility] [address] [place] [address] Dept: [phone] Loc: [phone]  DECOREY WAHLERT  05/27/2019  You are scheduled for a Cardiac Catheterization on Monday, March 22 with Dr. Nanetta Batty.  1. Please arrive at the [facility] (Main Entrance A) at [facility]: [address] [place], [place] [address] at 7:30 AM (This time is two hours before your procedure to ensure your preparation). Free valet parking service is available.   Special note: Every effort is made to have your procedure done on time.  Please understand that emergencies sometimes delay scheduled procedures.  2. Diet: Do not eat solid foods after midnight.  The patient may have clear liquids until 5am upon the day of the procedure.  3. Labs: You will need to have blood drawn today   4. Medication instructions in preparation for your procedure:   Contrast Allergy: No  Stop taking Eliquis (Apixiban) on Saturday, March 20.  On the morning of your procedure, take your Aspirin and any morning medicines NOT listed above.  You may use sips of water.  5. Plan for one night stay--bring personal belongings. 6. Bring a current list of your medications and current insurance cards. 7. You MUST have a responsible person to drive you home. 8. Someone MUST be with you the first 24 hours after you arrive home or your discharge will be delayed. 9. Please wear clothes that are easy to get on and off and wear slip-on shoes.  Thank you for allowing [place] to care for you!   -- [facility]     Signed, Lenna Gilford. Flora Lipps, MD  [facility]  [address] [place], [place] [address] [phone]  05/27/2019 12:32 PM

## 2019-05-25 NOTE — H&P (View-Only) (Signed)
Cardiology Office Note:   Date:  05/27/2019  NAME:  Andre Warren    MRN: [id number] DOB:  1945/04/06   PCP:  [name], MD  Cardiologist:  No primary care provider on file.   Referring MD: [name], MD   Chief Complaint  Patient presents with  . Shortness of Breath   History of Present Illness:   Andre Warren is a 74 y.o. male with a hx of CAD, HTN, Afib, CKD who is being seen today for the evaluation of shortness of breath at the request of [name], MD.  Andre Warren has an extensive history of CAD as detailed below.  He has had shortness of breath for what appears to be 1 year.  He reports when he does any heavy exertion he gets profoundly short of breath.  He reports tightness in his chest that occurs due to heavy breathing.  He also reports symptoms of lower extremity edema.  He underwent an echocardiogram at the [place] in [place] on 10/06/2018 demonstrated normal left ventricular function 55-60% with grade 3 diastolic dysfunction.  He was also noted to have mild aortic stenosis.  He was started on Lasix with minimal improvement in symptoms.  He underwent a recent exercise treadmill stress test on 05/03/2019 at the [facility].  He exercised for 3 minutes 40 seconds and had a drop in blood pressure from 135/76 down to 101/60.  There was no evidence of perfusion defect on nuclear medicine imaging.  He was switched to a Lexiscan stress test.  There was no evidence of 3 times daily either.  Due to persistent symptoms of shortness of breath as well as drop in blood pressure he was referred to [place] for cardiac catheterization by his cardiologist the [place].  He sees [name].  Recent chest x-ray demonstrates no acute cardiopulmonary pathology.  Interestingly, on the echocardiogram from 09/2018 he had an elevated RVSP around 70 mmHg.  He also had mild aortic stenosis with a peak aortic velocity of 2.7 m/s and mean gradient of 15.  His EKG today demonstrates normal  sinus rhythm with heart rate 65.  He has frequent PVCs, trigeminy noted which appear to be coming from the left ventricle.  He reports that he has not had this evaluated the [place].  His symptoms of shortness of breath appear to occur daily.  Worse with bending forward.  Worsened with exercise.  Occur daily.  Appear to not be improving despite diuretic therapy.  Given the abnormal cardiac stress test which was mainly the exercise component [facility] and left heart catheterization performed.  Problem List 1. CAD -CABG 2004 (LIMA-OM1, RIMA-LAD, SVG-PDA) -inferior STEMI 08/2017, occluded SVG-PDA managed medically 2. Atrial fibrillation -in setting of ACS 08/2017 on eliquis  3. CKD 3 4. HTN 5. PAD -R CEA -moderate R renal artery stenosis 70%  Past Medical History: Past Medical History:  Diagnosis Date  . CAD (coronary artery disease)   . Carotid artery occlusion   . CKD (chronic kidney disease) stage 2, GFR 60-89 ml/min   . H/O agent Orange exposure   . Hypercholesteremia   . Hypertension   . Peripheral arterial disease (HCC)    LCEA  . Subdural hematoma, post-traumatic (HCC) 03/2012   Saw Dr. Danielle Dess has been released.     Past Surgical History: Past Surgical History:  Procedure Laterality Date  . CAROTID ENDARTERECTOMY  2004  . CORONARY ANGIOPLASTY WITH STENT PLACEMENT  Became occluded and resulted in CABG  . CORONARY ARTERY BYPASS GRAFT     x 3  . LEFT HEART CATH AND CORS/GRAFTS ANGIOGRAPHY N/A 09/03/2017   Procedure: LEFT HEART CATH AND CORS/GRAFTS ANGIOGRAPHY;  Surgeon: Kathleene Hazel, MD;  Location: [facility] INV[facility]IVE CV LAB;  Service: Cardiovascular;  Laterality: N/A;    Current Medications: Current Meds  Medication Sig  . amLODipine (NORVASC) 10 MG tablet Take 10 mg by mouth daily.  . furosemide (LASIX) 20 MG tablet Take 20 mg by mouth.  . pantoprazole (PROTONIX) 40 MG tablet Take 40 mg by mouth daily.     Allergies:    Latex     Social History: Social History   Socioeconomic History  . Marital status: Married    Spouse name: Not on file  . Number of children: Not on file  . Years of education: Not on file  . Highest education level: Not on file  Occupational History  . Occupation: [employer]  Tobacco Use  . Smoking status: Never Smoker  . Smokeless tobacco: Never Used  Substance and Sexual Activity  . Alcohol use: No  . Drug use: No  . Sexual activity: Yes    Birth control/protection: None  Other Topics Concern  . Not on file  Social History Narrative  . Not on file   Social Determinants of Health   Financial Resource Strain:   . Difficulty of Paying Living Expenses:   Food Insecurity:   . Worried About Programme researcher, broadcasting/film/video in the Last Year:   . Barista in the Last Year:   Transportation Needs:   . Freight forwarder (Medical):   Marland Kitchen Lack of Transportation (Non-Medical):   Physical Activity:   . Days of Exercise per Week:   . Minutes of Exercise per Session:   Stress:   . Feeling of Stress :   Social Connections:   . Frequency of Communication with Friends and Family:   . Frequency of Social Gatherings with Friends and Family:   . Attends Religious Services:   . Active Member of Clubs or Organizations:   . Attends Banker Meetings:   Marland Kitchen Marital Status:      Family History: The patient's family history includes Cancer in his father and sister; Heart disease in his father, mother, and sister; Hypertension in his father and mother; Stroke in his father.  ROS:   All other ROS reviewed and negative. Pertinent positives noted in the HPI.     EKGs/Labs/Other Studies Reviewed:   The following studies were personally reviewed by me today:  EKG:  EKG is ordered today.  The ekg ordered today demonstrates normal sinus rhythm with no acute ischemic changes, ventricular trigeminy with PVCs likely originating from the LV, and was personally reviewed by me.   LHC 08/2017 1.  Triple vessel CAD s/p 3V CABG with 2/3 patent bypass grafts 2. Acute MI felt to be due to the acute occlusion of the SVG to the PDA. The RCA is chronically occluded in the mid segment, unchanged from pre-bypass cath films. The PDA is noted to be small and diffusely diseased on the operative report from 2004. The cath films from 2004 are reviewed and confirm a small PDA which was likely the target of his occluded vein graft. This vein graft appears to be heavily thrombotic. The PDA and posterolateral artery is seen to fill from left to right collaterals from the LAD.  3. The LAD is occluded in  the mid segment. The RIMA graft is patent and fills the mid and distal LAD 4. The Circumflex is occluded leading into a large obtuse marginal branch. The LIMA graft to the OM is patent.  5. Inferior wall hypokinesis with overall preserved LV systolic function  TTE 08/2017 - Left ventricle: The cavity size was normal. Wall thickness was  normal. Systolic function was normal. The estimated ejection  fraction was in the range of 55% to 60%. Mild hypokinesis of the  basalinferior myocardium.  - Mitral valve: There was mild to moderate regurgitation directed  posteriorly.  - Left atrium: The atrium was mildly dilated.  - Right ventricle: The cavity size was mildly dilated.  - Right atrium: The atrium was mildly dilated.  - Atrial septum: No defect or patent foramen ovale was identified.   Recent Labs: No results found for requested labs within last 8760 hours.   Recent Lipid Panel    Component Value Date/Time   CHOL 140 09/03/2017 1753   TRIG 73 09/03/2017 1753   HDL 39 (L) 09/03/2017 1753   CHOLHDL 3.6 09/03/2017 1753   VLDL 15 09/03/2017 1753   LDLCALC 86 09/03/2017 1753    Physical Exam:   VS:  BP 120/62   Pulse 65   Temp (!) 97 F (36.1 C)   Ht 5\' 8"  (1.727 m)   Wt 164 lb 6.4 oz (74.6 kg)   SpO2 97%   BMI 25.00 kg/m    Wt Readings from Last 3 Encounters:  05/27/19 164 lb 6.4 oz  (74.6 kg)  01/26/18 161 lb 2.5 oz (73.1 kg)  09/06/17 160 lb 4.8 oz (72.7 kg)    General: Well nourished, well developed, in no acute distress Heart: Atraumatic, normal size  Eyes: PEERLA, EOMI  Neck: Supple, no JVD Endocrine: No thryomegaly Cardiac: Normal S1, S2; RRR; no murmurs, rubs, or gallops Lungs: Clear to auscultation bilaterally, no wheezing, rhonchi or rales  Abd: Soft, nontender, no hepatomegaly  Ext: 1+ lower extremity edema Musculoskeletal: No deformities, BUE and BLE strength normal and equal Skin: Warm and dry, no rashes   Neuro: Alert and oriented to person, place, time, and situation, CNII-XII grossly intact, no focal deficits  Psych: Normal mood and affect   ASSESSMENT:   Andre Warren is a 74 y.o. male who presents for the following: 1. SOB (shortness of breath) on exertion   2. PVC (premature ventricular contraction)   3. Chronic diastolic heart failure (HCC)   4. Abnormal stress test   5. Coronary artery disease involving coronary bypass graft of native heart without angina pectoris   6. Essential hypertension   7. PAD (peripheral artery disease) (HCC)   8. Persistent atrial fibrillation (HCC)     PLAN:   1. SOB (shortness of breath) on exertion 2. PVC (premature ventricular contraction) 3. Chronic diastolic heart failure (HCC) 4. Abnormal stress test 5. Coronary artery disease involving coronary bypass graft of native heart without angina pectoris -History of CABG in 2004.  Most recent left heart catheterization in 2019 at [facility] shows patent LIMA to OM1, patent RIMA to LAD, occluded SVG to PDA.  He had a STEMI which was related to occluded SVG to PDA.  This was managed medically. -He describes symptoms of 1 year of progressive shortness of breath which appear to be heart failure related.  An echocardiogram last year showed grade 3 diastolic function and normal LV function.  He did have an RVSP around 70. -Recent cardiac stress test  at the  [facility] for which he exercised for 3 minutes 40 seconds and had a drop in systolic blood pressure from 135 down to 101 that was associated with symptoms.  He had normal perfusion on a subsequent Lexiscan.  His cardiologist referred him here today for left and right heart catheterization which we will proceed with.  He will have this done on Monday with Dr. York Ram.  He will hold his Eliquis starting today.  We will obtain BMP CBC as well as a BNP today in anticipation for his cardiac catheterization. -Overall have more suspicion this is related to worsening heart failure which needs to be evaluated.  We will proceed with an echocardiogram as well.  He did not take his Lasix today and have instructed him to do that today.  He will resume 40 mg daily. -He has been maintained on aspirin and he will continue that.  He will also continue his Lipitor. -He is also having frequent PVCs which could be a manifestation of decompensated heart failure.  Not grossly volume overloaded on exam today but does have lower extremity edema.  We will get a better idea of heart function on echocardiogram.  He may need a monitor to determine his PVC burden.  For now we will start with a left and right heart catheterization as well as echocardiogram.  He also obtain labs today.  6. Essential hypertension -Stable today.  He will continue his metoprolol tartrate 25 mg twice daily.  He was taken off losartan and we will continue to hold that.  He will continue his home amlodipine.  7. PAD (peripheral artery disease) (HCC) -Status post right CEA.  Also has right renal artery stenosis.  He is not a smoker.  We will continue with aspirin as well as cholesterol medications for now.  8. Atrial fibrillation -History of atrial fibrillation.  He also had a cardioversion at the [place] last year.  In normal sinus rhythm today.  He will hold his Eliquis starting today in anticipation for his cardiac catheterization on Monday.  I will see him  back in 3 weeks after the above testing.  Disposition: Return in about 3 weeks (around 06/17/2019).  Medication Adjustments/Labs and Tests Ordered: Current medicines are reviewed at length with the patient today.  Concerns regarding medicines are outlined above.  Orders Placed This Encounter  Procedures  . Basic metabolic panel  . Brain natriuretic peptide  . CBC  . EKG 12-Lead  . ECHOCARDIOGRAM COMPLETE   No orders of the defined types were placed in this encounter.   Patient Instructions  Medication Instructions:  Hold Eliquis 2 days prior to CATH on 03/22 *If you need a refill on your cardiac medications before your next appointment, please call your pharmacy*   Lab Work: BMET, BNOP, CBC today   If you have labs (blood work) drawn today and your tests are completely normal, you will receive your results only by: Marland Kitchen MyChart Message (if you have MyChart) OR . A paper copy in the mail If you have any lab test that is abnormal or we need to change your treatment, we will call you to review the results.   Testing/Procedures: Echocardiogram - Your physician has requested that you have an echocardiogram. Echocardiography is a painless test that uses sound waves to create images of your heart. It provides your doctor with information about the size and shape of your heart and how well your heart's chambers and valves are working. This procedure takes approximately  one hour. There are no restrictions for this procedure. This will be performed at our [facility] location - [address], [address].  Your physician has requested that you have a cardiac catheterization. Cardiac catheterization is used to diagnose and/or treat various heart conditions. Doctors may recommend this procedure for a number of different reasons. The most common reason is to evaluate chest pain. Chest pain can be a symptom of coronary artery disease (CAD), and cardiac catheterization can show whether plaque is  narrowing or blocking your heart's arteries. This procedure is also used to evaluate the valves, as well as measure the blood flow and oxygen levels in different parts of your heart. For further information please visit [email or web address] Please follow instruction sheet, as given.  Follow-Up: At [facility], you and your health needs are our priority.  As part of our continuing mission to provide you with exceptional heart care, we have created designated Provider Care Teams.  These Care Teams include your primary Cardiologist (physician) and Advanced Practice Providers (APPs -  Physician Assistants and Nurse Practitioners) who all work together to provide you with the care you need, when you need it.  We recommend signing up for the patient portal called "MyChart".  Sign up information is provided on this After Visit Summary.  MyChart is used to connect with patients for Virtual Visits (Telemedicine).  Patients are able to view lab/test results, encounter notes, upcoming appointments, etc.  Non-urgent messages can be sent to your provider as well.   To learn more about what you can do with MyChart, go to [email or web address].    Your next appointment:   3 week(s)  The format for your next appointment:   In Person  Provider:   Lennie Odor, MD   Other Instructions     [facility] CARDIOVASCULAR DIVISION [facility] [address] [place] [address] Dept: [phone] Loc: [phone]  Andre Warren  05/27/2019  You are scheduled for a Cardiac Catheterization on Monday, March 22 with Dr. Nanetta Batty.  1. Please arrive at the [facility] (Main Entrance A) at [facility]: [address] [place], [place] [address] at 7:30 AM (This time is two hours before your procedure to ensure your preparation). Free valet parking service is available.   Special note: Every effort is made to have your procedure done on time.  Please understand that emergencies sometimes delay scheduled procedures.  2. Diet: Do not eat solid foods after midnight.  The patient may have clear liquids until 5am upon the day of the procedure.  3. Labs: You will need to have blood drawn today   4. Medication instructions in preparation for your procedure:   Contrast Allergy: No  Stop taking Eliquis (Apixiban) on Saturday, March 20.  On the morning of your procedure, take your Aspirin and any morning medicines NOT listed above.  You may use sips of water.  5. Plan for one night stay--bring personal belongings. 6. Bring a current list of your medications and current insurance cards. 7. You MUST have a responsible person to drive you home. 8. Someone MUST be with you the first 24 hours after you arrive home or your discharge will be delayed. 9. Please wear clothes that are easy to get on and off and wear slip-on shoes.  Thank you for allowing [place] to care for you!   -- [place] [facility]     Signed, Lenna Gilford. Flora Lipps, MD  [facility]  [address] [place], [place] [address] [phone]  05/27/2019 12:32 PM

## 2019-05-27 ENCOUNTER — Ambulatory Visit (INDEPENDENT_AMBULATORY_CARE_PROVIDER_SITE_OTHER): Payer: No Typology Code available for payment source | Admitting: Cardiovascular Disease

## 2019-05-27 ENCOUNTER — Other Ambulatory Visit (HOSPITAL_COMMUNITY)
Admission: RE | Admit: 2019-05-27 | Discharge: 2019-05-27 | Disposition: A | Discharge status: Home / Self Care | Payer: Medicare Other | Source: Ambulatory Visit | Attending: Cardiovascular Disease | Admitting: Cardiovascular Disease

## 2019-05-27 ENCOUNTER — Other Ambulatory Visit: Payer: Self-pay

## 2019-05-27 ENCOUNTER — Encounter: Payer: Self-pay | Admitting: Cardiovascular Disease

## 2019-05-27 VITALS — BP 120/62 | HR 65 | Temp 97.0°F | Ht 68.0 in | Wt 164.4 lb

## 2019-05-27 DIAGNOSIS — I5032 Chronic diastolic (congestive) heart failure: Secondary | ICD-10-CM

## 2019-05-27 DIAGNOSIS — I10 Essential (primary) hypertension: Secondary | ICD-10-CM

## 2019-05-27 DIAGNOSIS — R0602 Shortness of breath: Secondary | ICD-10-CM

## 2019-05-27 DIAGNOSIS — I4819 Other persistent atrial fibrillation: Secondary | ICD-10-CM

## 2019-05-27 DIAGNOSIS — I739 Peripheral vascular disease, unspecified: Secondary | ICD-10-CM

## 2019-05-27 DIAGNOSIS — Z01812 Encounter for preprocedural laboratory examination: Secondary | ICD-10-CM | POA: Insufficient documentation

## 2019-05-27 DIAGNOSIS — I493 Ventricular premature depolarization: Secondary | ICD-10-CM

## 2019-05-27 DIAGNOSIS — R9439 Abnormal result of other cardiovascular function study: Secondary | ICD-10-CM | POA: Diagnosis not present

## 2019-05-27 DIAGNOSIS — Z20822 Contact with and (suspected) exposure to covid-19: Secondary | ICD-10-CM | POA: Diagnosis not present

## 2019-05-27 DIAGNOSIS — I25810 Atherosclerosis of coronary artery bypass graft(s) without angina pectoris: Secondary | ICD-10-CM

## 2019-05-27 LAB — SARS CORONAVIRUS 2 (TAT 6-24 HRS): SARS Coronavirus 2: NEGATIVE

## 2019-05-27 NOTE — Patient Instructions (Addendum)
Medication Instructions:  Hold Eliquis 2 days prior to CATH on 03/22 *If you need a refill on your cardiac medications before your next appointment, please call your pharmacy*   Lab Work: BMET, BNOP, CBC today   If you have labs (blood work) drawn today and your tests are completely normal, you will receive your results only by: Marland Kitchen MyChart Message (if you have MyChart) OR . A paper copy in the mail If you have any lab test that is abnormal or we need to change your treatment, we will call you to review the results.   Testing/Procedures: Echocardiogram - Your physician has requested that you have an echocardiogram. Echocardiography is a painless test that uses sound waves to create images of your heart. It provides your doctor with information about the size and shape of your heart and how well your heart's chambers and valves are working. This procedure takes approximately one hour. There are no restrictions for this procedure. This will be performed at our [facility] location - [address].  Your physician has requested that you have a cardiac catheterization. Cardiac catheterization is used to diagnose and/or treat various heart conditions. Doctors may recommend this procedure for a number of different reasons. The most common reason is to evaluate chest pain. Chest pain can be a symptom of coronary artery disease (CAD), and cardiac catheterization can show whether plaque is narrowing or blocking your heart's arteries. This procedure is also used to evaluate the valves, as well as measure the blood flow and oxygen levels in different parts of your heart. For further information please visit [email or web address]. Please follow instruction sheet, as given.  Follow-Up: At [facility], you and your health needs are our priority.  As part of our continuing mission to provide you with exceptional heart care, we have created designated Provider Care Teams.  These Care Teams include  your primary Cardiologist (physician) and Advanced Practice Providers (APPs -  Physician Assistants and Nurse Practitioners) who all work together to provide you with the care you need, when you need it.  We recommend signing up for the patient portal called "MyChart".  Sign up information is provided on this After Visit Summary.  MyChart is used to connect with patients for Virtual Visits (Telemedicine).  Patients are able to view lab/test results, encounter notes, upcoming appointments, etc.  Non-urgent messages can be sent to your provider as well.   To learn more about what you can do with MyChart, go to [email or web address].    Your next appointment:   3 week(s)  The format for your next appointment:   In Person  Provider:   Eleonore Chiquito, MD   Other Instructions     [place] [address] Dept: [phone] Loc: [place]  05/27/2019  You are scheduled for a Cardiac Catheterization on Monday, March 22 with Dr. Quay Burow.  1. Please arrive at the [facility] (Main Entrance A) at [facility]: [address] [place], [place] [address] at 7:30 AM (This time is two hours before your procedure to ensure your preparation). Free valet parking service is available.   Special note: Every effort is made to have your procedure done on time. Please understand that emergencies sometimes delay scheduled procedures.  2. Diet: Do not eat solid foods after midnight.  The patient may have clear liquids until 5am upon the day of the  procedure.  3. Labs: You will need to have blood drawn today   4. Medication instructions in preparation for your procedure:   Contrast Allergy: No  Stop taking Eliquis (Apixiban) on Saturday, March 20.  On the morning of your procedure, take your Aspirin and any morning medicines NOT listed above.  You may use  sips of water.  5. Plan for one night stay--bring personal belongings. 6. Bring a current list of your medications and current insurance cards. 7. You MUST have a responsible person to drive you home. 8. Someone MUST be with you the first 24 hours after you arrive home or your discharge will be delayed. 9. Please wear clothes that are easy to get on and off and wear slip-on shoes.  Thank you for allowing [place] to care for you!   -- [place] [facility]

## 2019-05-28 LAB — CBC
Hematocrit: 41.7 % (ref 37.5–51.0)
Hemoglobin: 14.3 g/dL (ref 13.0–17.7)
MCH: 33.1 pg — ABNORMAL HIGH (ref 26.6–33.0)
MCHC: 34.3 g/dL (ref 31.5–35.7)
MCV: 97 fL (ref 79–97)
Platelets: 171 10*3/uL (ref 150–450)
RBC: 4.32 x10E6/uL (ref 4.14–5.80)
RDW: 13.2 % (ref 11.6–15.4)
WBC: 8.4 10*3/uL (ref 3.4–10.8)

## 2019-05-28 LAB — BASIC METABOLIC PANEL
BUN/Creatinine Ratio: 12 (ref 10–24)
BUN: 19 mg/dL (ref 8–27)
CO2: 20 mmol/L (ref 20–29)
Calcium: 11.3 mg/dL — ABNORMAL HIGH (ref 8.6–10.2)
Chloride: 100 mmol/L (ref 96–106)
Creatinine, Ser: 1.56 mg/dL — ABNORMAL HIGH (ref 0.76–1.27)
GFR calc Af Amer: 50 mL/min/{1.73_m2} — ABNORMAL LOW (ref >59)
GFR calc non Af Amer: 43 mL/min/{1.73_m2} — ABNORMAL LOW (ref >59)
Glucose: 100 mg/dL — ABNORMAL HIGH (ref 65–99)
Potassium: 5.3 mmol/L — ABNORMAL HIGH (ref 3.5–5.2)
Sodium: 140 mmol/L (ref 134–144)

## 2019-05-28 LAB — BRAIN NATRIURETIC PEPTIDE: BNP: 232.2 pg/mL — ABNORMAL HIGH (ref 0.0–100.0)

## 2019-05-30 ENCOUNTER — Other Ambulatory Visit: Payer: Self-pay

## 2019-05-30 ENCOUNTER — Ambulatory Visit (HOSPITAL_COMMUNITY)
Admission: RE | Admit: 2019-05-30 | Discharge: 2019-05-30 | Disposition: A | Discharge status: Home / Self Care | Payer: No Typology Code available for payment source | Attending: Cardiovascular Disease | Admitting: Cardiovascular Disease

## 2019-05-30 ENCOUNTER — Encounter (HOSPITAL_COMMUNITY)
Admission: RE | Disposition: A | Discharge status: Home / Self Care | Payer: Self-pay | Source: Home / Self Care | Attending: Cardiovascular Disease

## 2019-05-30 DIAGNOSIS — I739 Peripheral vascular disease, unspecified: Secondary | ICD-10-CM | POA: Insufficient documentation

## 2019-05-30 DIAGNOSIS — I25810 Atherosclerosis of coronary artery bypass graft(s) without angina pectoris: Secondary | ICD-10-CM | POA: Diagnosis not present

## 2019-05-30 DIAGNOSIS — E7800 Pure hypercholesterolemia, unspecified: Secondary | ICD-10-CM | POA: Diagnosis not present

## 2019-05-30 DIAGNOSIS — I350 Nonrheumatic aortic (valve) stenosis: Secondary | ICD-10-CM | POA: Insufficient documentation

## 2019-05-30 DIAGNOSIS — I2119 ST elevation (STEMI) myocardial infarction involving other coronary artery of inferior wall: Secondary | ICD-10-CM | POA: Diagnosis present

## 2019-05-30 DIAGNOSIS — I493 Ventricular premature depolarization: Secondary | ICD-10-CM | POA: Diagnosis not present

## 2019-05-30 DIAGNOSIS — I701 Atherosclerosis of renal artery: Secondary | ICD-10-CM | POA: Insufficient documentation

## 2019-05-30 DIAGNOSIS — I5032 Chronic diastolic (congestive) heart failure: Secondary | ICD-10-CM | POA: Diagnosis not present

## 2019-05-30 DIAGNOSIS — N183 Chronic kidney disease, stage 3 unspecified: Secondary | ICD-10-CM | POA: Diagnosis not present

## 2019-05-30 DIAGNOSIS — I130 Hypertensive heart and chronic kidney disease with heart failure and stage 1 through stage 4 chronic kidney disease, or unspecified chronic kidney disease: Secondary | ICD-10-CM | POA: Diagnosis not present

## 2019-05-30 DIAGNOSIS — Z8249 Family history of ischemic heart disease and other diseases of the circulatory system: Secondary | ICD-10-CM | POA: Diagnosis not present

## 2019-05-30 DIAGNOSIS — Z79899 Other long term (current) drug therapy: Secondary | ICD-10-CM | POA: Diagnosis not present

## 2019-05-30 DIAGNOSIS — I2510 Atherosclerotic heart disease of native coronary artery without angina pectoris: Secondary | ICD-10-CM | POA: Diagnosis not present

## 2019-05-30 DIAGNOSIS — Z955 Presence of coronary angioplasty implant and graft: Secondary | ICD-10-CM | POA: Insufficient documentation

## 2019-05-30 DIAGNOSIS — I252 Old myocardial infarction: Secondary | ICD-10-CM | POA: Insufficient documentation

## 2019-05-30 DIAGNOSIS — I4819 Other persistent atrial fibrillation: Secondary | ICD-10-CM | POA: Insufficient documentation

## 2019-05-30 DIAGNOSIS — Z951 Presence of aortocoronary bypass graft: Secondary | ICD-10-CM | POA: Diagnosis not present

## 2019-05-30 HISTORY — PX: RIGHT/LEFT HEART CATH AND CORONARY/GRAFT ANGIOGRAPHY: CATH118267

## 2019-05-30 LAB — POCT I-STAT 7, (LYTES, BLD GAS, ICA,H+H)
Acid-base deficit: 1 mmol/L (ref 0.0–2.0)
Bicarbonate: 23.6 mmol/L (ref 20.0–28.0)
Calcium, Ion: 1.29 mmol/L (ref 1.15–1.40)
HCT: 35 % — ABNORMAL LOW (ref 39.0–52.0)
Hemoglobin: 11.9 g/dL — ABNORMAL LOW (ref 13.0–17.0)
O2 Saturation: 100 %
Potassium: 3.8 mmol/L (ref 3.5–5.1)
Sodium: 140 mmol/L (ref 135–145)
TCO2: 25 mmol/L (ref 22–32)
pCO2 arterial: 37.2 mmHg (ref 32.0–48.0)
pH, Arterial: 7.411 (ref 7.350–7.450)
pO2, Arterial: 235 mmHg — ABNORMAL HIGH (ref 83.0–108.0)

## 2019-05-30 LAB — POCT I-STAT EG7
Acid-base deficit: 1 mmol/L (ref 0.0–2.0)
Bicarbonate: 24.4 mmol/L (ref 20.0–28.0)
Bicarbonate: 24.6 mmol/L (ref 20.0–28.0)
Calcium, Ion: 1.37 mmol/L (ref 1.15–1.40)
Calcium, Ion: 1.37 mmol/L (ref 1.15–1.40)
HCT: 35 % — ABNORMAL LOW (ref 39.0–52.0)
HCT: 36 % — ABNORMAL LOW (ref 39.0–52.0)
Hemoglobin: 11.9 g/dL — ABNORMAL LOW (ref 13.0–17.0)
Hemoglobin: 12.2 g/dL — ABNORMAL LOW (ref 13.0–17.0)
O2 Saturation: 75 %
O2 Saturation: 76 %
Potassium: 3.9 mmol/L (ref 3.5–5.1)
Potassium: 3.9 mmol/L (ref 3.5–5.1)
Sodium: 140 mmol/L (ref 135–145)
Sodium: 140 mmol/L (ref 135–145)
TCO2: 26 mmol/L (ref 22–32)
TCO2: 26 mmol/L (ref 22–32)
pCO2, Ven: 40.5 mmHg — ABNORMAL LOW (ref 44.0–60.0)
pCO2, Ven: 40.7 mmHg — ABNORMAL LOW (ref 44.0–60.0)
pH, Ven: 7.387 (ref 7.250–7.430)
pH, Ven: 7.392 (ref 7.250–7.430)
pO2, Ven: 40 mmHg (ref 32.0–45.0)
pO2, Ven: 42 mmHg (ref 32.0–45.0)

## 2019-05-30 SURGERY — RIGHT/LEFT HEART CATH AND CORONARY/GRAFT ANGIOGRAPHY
Anesthesia: LOCAL

## 2019-05-30 MED ORDER — HEPARIN (PORCINE) IN NACL 1000-0.9 UT/500ML-% IV SOLN
INTRAVENOUS | Status: DC | PRN
  Administered 2019-05-30: 500 mL

## 2019-05-30 MED ORDER — SODIUM CHLORIDE 0.9% FLUSH: 3.0000 mL | INTRAVENOUS | Status: DC | PRN

## 2019-05-30 MED ORDER — ASPIRIN 81 MG PO CHEW
81.0000 mg | CHEWABLE_TABLET | ORAL | Status: AC
  Administered 2019-05-30: 81 mg via ORAL
  Filled 2019-05-30: qty 1

## 2019-05-30 MED ORDER — LIDOCAINE HCL (PF) 1 % IJ SOLN
INTRAMUSCULAR | Status: AC
  Filled 2019-05-30: qty 30

## 2019-05-30 MED ORDER — MORPHINE SULFATE (PF) 2 MG/ML IV SOLN: 2.0000 mg | INTRAVENOUS | Status: DC | PRN

## 2019-05-30 MED ORDER — SODIUM CHLORIDE 0.9 % IV SOLN: 250.0000 mL | INTRAVENOUS | Status: DC | PRN

## 2019-05-30 MED ORDER — SODIUM CHLORIDE 0.9% FLUSH: 3.0000 mL | Freq: Two times a day (BID) | INTRAVENOUS | Status: DC

## 2019-05-30 MED ORDER — HEPARIN (PORCINE) IN NACL 1000-0.9 UT/500ML-% IV SOLN
INTRAVENOUS | Status: AC
  Filled 2019-05-30: qty 1000

## 2019-05-30 MED ORDER — SODIUM CHLORIDE 0.9 % IV SOLN: INTRAVENOUS | Status: DC

## 2019-05-30 MED ORDER — IOHEXOL 350 MG/ML SOLN
INTRAVENOUS | Status: DC | PRN
  Administered 2019-05-30: 30 mL

## 2019-05-30 MED ORDER — HYDRALAZINE HCL 20 MG/ML IJ SOLN: 10.0000 mg | INTRAMUSCULAR | Status: DC | PRN

## 2019-05-30 MED ORDER — LIDOCAINE HCL (PF) 1 % IJ SOLN
INTRAMUSCULAR | Status: DC | PRN
  Administered 2019-05-30: 15 mL

## 2019-05-30 MED ORDER — LABETALOL HCL 5 MG/ML IV SOLN: 10.0000 mg | INTRAVENOUS | Status: DC | PRN

## 2019-05-30 MED ORDER — ONDANSETRON HCL 4 MG/2ML IJ SOLN: 4.0000 mg | Freq: Four times a day (QID) | INTRAMUSCULAR | Status: DC | PRN

## 2019-05-30 MED ORDER — ACETAMINOPHEN 325 MG PO TABS: 650.0000 mg | ORAL_TABLET | ORAL | Status: DC | PRN

## 2019-05-30 MED ORDER — ASPIRIN 81 MG PO CHEW: 81.0000 mg | CHEWABLE_TABLET | Freq: Every day | ORAL | Status: DC

## 2019-05-30 SURGICAL SUPPLY — 13 items
CATH INFINITI 5FR MULTPACK ANG (CATHETERS) ×2 IMPLANT
CATH SWAN GANZ 7F STRAIGHT (CATHETERS) ×2 IMPLANT
CLOSURE MYNX CONTROL 5F (Vascular Products) ×2 IMPLANT
CLOSURE MYNX CONTROL 6F/7F (Vascular Products) ×2 IMPLANT
KIT HEART LEFT (KITS) ×2 IMPLANT
PACK CARDIAC CATHETERIZATION (CUSTOM PROCEDURE TRAY) ×2 IMPLANT
SHEATH PINNACLE 5F 10CM (SHEATH) ×2 IMPLANT
SHEATH PINNACLE 7F 10CM (SHEATH) ×2 IMPLANT
TRANSDUCER W/STOPCOCK (MISCELLANEOUS) ×2 IMPLANT
TUBING ART PRESS 72  MALE/FEM (TUBING) ×2
TUBING ART PRESS 72 MALE/FEM (TUBING) ×1 IMPLANT
TUBING CIL FLEX 10 FLL-RA (TUBING) ×2 IMPLANT
WIRE EMERALD ST .035X150CM (WIRE) ×2 IMPLANT

## 2019-05-30 NOTE — Progress Notes (Signed)
Called and spoke with Lillia Abed PA/ cards, restart eliquis tomorrow if no bleeding.

## 2019-05-30 NOTE — Discharge Instructions (Signed)
Restart Eliquis tomorrow if no bleeding   Angiogram, Care After This sheet gives you information about how to care for yourself after your procedure. Your health care provider may also give you more specific instructions. If you have problems or questions, contact your health care provider. What can I expect after the procedure? After the procedure, it is common to have bruising and tenderness at the catheter insertion area. Follow these instructions at home: Insertion site care  Follow instructions from your health care provider about how to take care of your insertion site. Make sure you: ? Wash your hands with soap and water before you change your bandage (dressing). If soap and water are not available, use hand sanitizer. ? Change your dressing as told by your health care provider. ? Leave stitches (sutures), skin glue, or adhesive strips in place. These skin closures may need to stay in place for 2 weeks or longer. If adhesive strip edges start to loosen and curl up, you may trim the loose edges. Do not remove adhesive strips completely unless your health care provider tells you to do that.  Do not take baths, swim, or use a hot tub until your health care provider approves.  You may shower 24-48 hours after the procedure or as told by your health care provider. ? Gently wash the site with plain soap and water. ? Pat the area dry with a clean towel. ? Do not rub the site. This may cause bleeding.  Do not apply powder or lotion to the site. Keep the site clean and dry.  Check your insertion site every day for signs of infection. Check for: ? Redness, swelling, or pain. ? Fluid or blood. ? Warmth. ? Pus or a bad smell. Activity  Rest as told by your health care provider, usually for 1-2 days.  Do not lift anything that is heavier than 10 lbs. (4.5 kg) or as told by your health care provider.  Do not drive for 24 hours if you were given a medicine to help you relax  (sedative).  Do not drive or use heavy machinery while taking prescription pain medicine. General instructions   Return to your normal activities as told by your health care provider, usually in about a week. Ask your health care provider what activities are safe for you.  If the catheter site starts bleeding, lie flat and put pressure on the site. If the bleeding does not stop, get help right away. This is a medical emergency.  Drink enough fluid to keep your urine clear or pale yellow. This helps flush the contrast dye from your body.  Take over-the-counter and prescription medicines only as told by your health care provider.  Keep all follow-up visits as told by your health care provider. This is important. Contact a health care provider if:  You have a fever or chills.  You have redness, swelling, or pain around your insertion site.  You have fluid or blood coming from your insertion site.  The insertion site feels warm to the touch.  You have pus or a bad smell coming from your insertion site.  You have bruising around the insertion site.  You notice blood collecting in the tissue around the catheter site (hematoma). The hematoma may be painful to the touch. Get help right away if:  You have severe pain at the catheter insertion area.  The catheter insertion area swells very fast.  The catheter insertion area is bleeding, and the bleeding does not stop  when you hold steady pressure on the area.  The area near or just beyond the catheter insertion site becomes pale, cool, tingly, or numb. These symptoms may represent a serious problem that is an emergency. Do not wait to see if the symptoms will go away. Get medical help right away. Call your local emergency services (911 in the [place] Do not drive yourself to the hospital. Summary  After the procedure, it is common to have bruising and tenderness at the catheter insertion area.  After the procedure, it is important to  rest and drink plenty of fluids.  Do not take baths, swim, or use a hot tub until your health care provider says it is okay to do so. You may shower 24-48 hours after the procedure or as told by your health care provider.  If the catheter site starts bleeding, lie flat and put pressure on the site. If the bleeding does not stop, get help right away. This is a medical emergency. This information is not intended to replace advice given to you by your health care provider. Make sure you discuss any questions you have with your health care provider. Document Revised: 02/06/2017 Document Reviewed: 01/30/2016 Elsevier Patient Education  2020 [employer].

## 2019-05-30 NOTE — Interval H&P Note (Signed)
Cath Lab Visit (complete for each Cath Lab visit)  Clinical Evaluation Leading to the Procedure:   ACS: No.  Non-ACS:    Anginal Classification: CCS II  Anti-ischemic medical therapy: Minimal Therapy (1 class of medications)  Non-Invasive Test Results: No non-invasive testing performed  Prior CABG: Previous CABG      History and Physical Interval Note:  05/30/2019 8:43 AM  Andre Warren  has presented today for surgery, with the diagnosis of Abnormal stress test.  The various methods of treatment have been discussed with the patient and family. After consideration of risks, benefits and other options for treatment, the patient has consented to  Procedure(s): RIGHT/LEFT HEART CATH AND CORONARY/GRAFT ANGIOGRAPHY (N/A) as a surgical intervention.  The patient's history has been reviewed, patient examined, no change in status, stable for surgery.  I have reviewed the patient's chart and labs.  Questions were answered to the patient's satisfaction.     Nanetta Batty

## 2019-06-12 NOTE — Progress Notes (Signed)
Cardiology Office Note:   Date:  06/14/2019  NAME:  Andre Warren    MRN: [id number] DOB:  [birth year]   PCP:  Geralynn Rile, MD  Cardiologist:  No primary care provider on file.   Referring MD: Unk Pinto, MD   Chief Complaint  Patient presents with  . Shortness of Breath    History of Present Illness:   Andre Warren is a 74 y.o. male with a hx of CAD, HTN, Afib, CKD who presents for follow-up of SOB. Evaluated at the [facility] for SOB with stress test. Had drop in BP and was sent here for left and right heart cath. Left heart cath was unchanged from 2019. LVEDP 11. PCWP 9 mmHG. LHC 2019: SVG-PDA occluded, patent LIMA-OM1, RIMA-LAD, 100%pLAD, 100%pLCX, 100% RCA LHC 05/30/2019: Unchanged from 2019.  His echocardiogram was performed which shows preserved ejection fraction 60-65%.  He was noted to have grade 2 diastolic dysfunction.  His blood work showed an elevated BNP at 232.  Overall explained him that I think he has diastolic dysfunction.  His weights are up around 10 pound since her last visit.  I highly suspect this is the cause of his shortness of breath.  He reports his shortness of breath been going on for about 2 years.  He does have symptoms of shortness of breath when he exerts himself such as climbing flight of stairs or bends over.  This is classic bendopnea.  He has noticeable lower extremity edema today as well as elevated JVD.  He did not take his Lasix today due to the fact that he was coming to his appointment.  Problem List 1. CAD -CABG 2004 (LIMA-OM1, RIMA-LAD, SVG-PDA) -inferior STEMI 08/2017, occluded SVG-PDA managed medically 2. Atrial fibrillation -in setting of ACS 08/2017 on eliquis  3. CKD 3 4. HTN 5. PAD -R CEA -moderate R renal artery stenosis 70%  Past Medical History: Past Medical History:  Diagnosis Date  . CAD (coronary artery disease)   . Carotid artery occlusion   . CKD (chronic kidney disease) stage 2, GFR 60-89 ml/min   . H/O agent  [place] exposure   . Hypercholesteremia   . Hypertension   . Peripheral arterial disease ([place])    LCEA  . Subdural hematoma, post-traumatic ([place]) 03/2012   Saw Dr. Ellene Route has been released.     Past Surgical History: Past Surgical History:  Procedure Laterality Date  . CAROTID ENDARTERECTOMY  2004  . CORONARY ANGIOPLASTY WITH STENT PLACEMENT     Became occluded and resulted in CABG  . CORONARY ARTERY BYPASS GRAFT     x 3  . LEFT HEART CATH AND CORS/GRAFTS ANGIOGRAPHY N/A 09/03/2017   Procedure: LEFT HEART CATH AND CORS/GRAFTS ANGIOGRAPHY;  Surgeon: Burnell Blanks, MD;  Location: [place] [facility];  Service: Cardiovascular;  Laterality: N/A;  . RIGHT/LEFT HEART CATH AND CORONARY/GRAFT ANGIOGRAPHY N/A 05/30/2019   Procedure: RIGHT/LEFT HEART CATH AND CORONARY/GRAFT ANGIOGRAPHY;  Surgeon: Lorretta Harp, MD;  Location: [place] [facility];  Service: Cardiovascular;  Laterality: N/A;    Current Medications: Current Meds  Medication Sig  . amLODipine (NORVASC) 10 MG tablet Take 10 mg by mouth daily.  Marland Kitchen apixaban (ELIQUIS) 5 MG TABS tablet Take 1 tablet (5 mg total) by mouth 2 (two) times daily.  Marland Kitchen atorvastatin (LIPITOR) 80 MG tablet Take 1 tablet (80 mg total) by mouth daily.  Marland Kitchen BABY ASPIRIN PO Take 81 mg by mouth daily.  . furosemide (LASIX) 20 MG tablet  Take 2 tablets (40 mg) for 3 days, then resume 1 table (20 mg) daily  . metoprolol tartrate (LOPRESSOR) 25 MG tablet Take 1 tablet (25 mg total) by mouth 2 (two) times daily.  . pantoprazole (PROTONIX) 40 MG tablet Take 40 mg by mouth daily.  . [DISCONTINUED] furosemide (LASIX) 20 MG tablet Take 20 mg by mouth.     Allergies:    Latex   Social History: Social History   Socioeconomic History  . Marital status: Married    Spouse name: Not on file  . Number of children: Not on file  . Years of education: Not on file  . Highest education level: Not on file  Occupational History  . Occupation: [employer]  Tobacco Use   . Smoking status: Never Smoker  . Smokeless tobacco: Never Used  Substance and Sexual Activity  . Alcohol use: No  . Drug use: No  . Sexual activity: Yes    Birth control/protection: None  Other Topics Concern  . Not on file  Social History Narrative  . Not on file   Social Determinants of Health   Financial Resource Strain:   . Difficulty of Paying Living Expenses:   Food Insecurity:   . Worried About Programme researcher, broadcasting/film/video in the Last Year:   . Barista in the Last Year:   Transportation Needs:   . Freight forwarder (Medical):   Marland Kitchen Lack of Transportation (Non-Medical):   Physical Activity:   . Days of Exercise per Week:   . Minutes of Exercise per Session:   Stress:   . Feeling of Stress :   Social Connections:   . Frequency of Communication with Friends and Family:   . Frequency of Social Gatherings with Friends and Family:   . Attends Religious Services:   . Active Member of Clubs or Organizations:   . Attends Banker Meetings:   Marland Kitchen Marital Status:      Family History: The patient's family history includes Cancer in his father and sister; Heart disease in his father, mother, and sister; Hypertension in his father and mother; Stroke in his father.  ROS:   All other ROS reviewed and negative. Pertinent positives noted in the HPI.     EKGs/Labs/Other Studies Reviewed:   The following studies were personally reviewed by me today:  Recent Labs: 05/27/2019: BNP 232.2; BUN 19; Creatinine, Ser 1.56; Platelets 171 05/30/2019: Hemoglobin 11.9; Potassium 3.8; Sodium 140   Recent Lipid Panel    Component Value Date/Time   CHOL 140 09/03/2017 1753   TRIG 73 09/03/2017 1753   HDL 39 (L) 09/03/2017 1753   CHOLHDL 3.6 09/03/2017 1753   VLDL 15 09/03/2017 1753   LDLCALC 86 09/03/2017 1753    Physical Exam:   VS:  BP 120/62   Pulse (!) 51   Ht 5\' 8"  (1.727 m)   Wt 163 lb (73.9 kg)   BMI 24.78 kg/m    Wt Readings from Last 3 Encounters:   06/14/19 163 lb (73.9 kg)  05/30/19 155 lb (70.3 kg)  05/27/19 164 lb 6.4 oz (74.6 kg)    General: Well nourished, well developed, in no acute distress Heart: Atraumatic, normal size  Eyes: PEERLA, EOMI  Neck: Supple, JVD 10-12 cm of water Endocrine: No thryomegaly Cardiac: Normal S1, S2; RRR; no murmurs, rubs, or gallops Lungs: Clear to auscultation bilaterally, no wheezing, rhonchi or rales  Abd: Soft, nontender, no hepatomegaly  Ext: 2+ lower extremity edema Musculoskeletal:  No deformities, BUE and BLE strength normal and equal Skin: Warm and dry, no rashes   Neuro: Alert and oriented to person, place, time, and situation, CNII-XII grossly intact, no focal deficits  Psych: Normal mood and affect   ASSESSMENT:   Andre Warren is a 74 y.o. male who presents for the following: 1. SOB (shortness of breath) on exertion   2. Chronic heart failure with preserved ejection fraction (HCC)   3. Coronary artery disease involving coronary bypass graft of native heart without angina pectoris   4. Essential hypertension   5. Paroxysmal atrial fibrillation (HCC)   6. PAD (peripheral artery disease) (HCC)     PLAN:   1. SOB (shortness of breath) on exertion 2. Chronic heart failure with preserved ejection fraction (HCC) -He has classic symptoms of heart failure with preserved ejection fraction.  His BNP was elevated.  He describes symptoms of shortness of breath as well as bendopnea.  He does have lower extremity edema today as well as elevated JVD.  Although his heart catheterization showed no elevation of wedge pressure I think he may have been dry that day.  We will increase his Lasix to 40 mg a day for 3 days and then he will resume 20 mg daily. -Blood pressure well controlled.  He will continue his home metoprolol and amlodipine.  -I counseled him extensively about salt reduction strategies.  He does eat out a lot as well as processed foods.  He will work on this. -Moving forward he will  resume care at the [place].  3. Coronary artery disease involving coronary bypass graft of native heart without angina pectoris -Left heart catheterization unchanged from prior cath.  No new areas of concern.  I suspect his abnormal stress test was just a vagal response and not related to any worsening heart function or CAD. -He will continue aspirin and his Eliquis moving forward. -No symptoms of angina today.  4. Essential hypertension -Well-controlled today continue current medications.  5. Paroxysmal atrial fibrillation (HCC) -He will resume his home Eliquis.  No recurrence of atrial fibrillation that I can tell.  6. PAD (peripheral artery disease) (HCC) -Status post right CEA.  Continue aspirin and cholesterol-lowering medications.  Disposition: Return if symptoms worsen or fail to improve.  Medication Adjustments/Labs and Tests Ordered: Current medicines are reviewed at length with the patient today.  Concerns regarding medicines are outlined above.  No orders of the defined types were placed in this encounter.  Meds ordered this encounter  Medications  . furosemide (LASIX) 20 MG tablet    Sig: Take 2 tablets (40 mg) for 3 days, then resume 1 table (20 mg) daily    Dispense:  90 tablet    Refill:  2    Patient Instructions  Medication Instructions:  Increase Lasix to 40 mg for 3 days, then resume 20 mg daily   *If you need a refill on your cardiac medications before your next appointment, please call your pharmacy*  Follow-Up: At [facility], you and your health needs are our priority.  As part of our continuing mission to provide you with exceptional heart care, we have created designated Provider Care Teams.  These Care Teams include your primary Cardiologist (physician) and Advanced Practice Providers (APPs -  Physician Assistants and Nurse Practitioners) who all work together to provide you with the care you need, when you need it.  We recommend signing up for the  patient portal called "MyChart".  Sign up information is provided  on this After Visit Summary.  MyChart is used to connect with patients for Virtual Visits (Telemedicine).  Patients are able to view lab/test results, encounter notes, upcoming appointments, etc.  Non-urgent messages can be sent to your provider as well.   To learn more about what you can do with MyChart, go to [email or web address].    Your next appointment:   PRN  The format for your next appointment:   In Person  Provider:   Lennie Odor, MD       Time Spent with Patient: I have spent a total of 35 minutes with patient reviewing hospital notes, telemetry, EKGs, labs and examining the patient as well as establishing an assessment and plan that was discussed with the patient.  > 50% of time was spent in direct patient care.  Signed, Lenna Gilford. Flora Lipps, MD [facility]  [address], [address] [place], [place] [address] [phone]  06/14/2019 9:44 AM

## 2019-06-13 ENCOUNTER — Ambulatory Visit (HOSPITAL_COMMUNITY): Payer: No Typology Code available for payment source | Attending: Cardiovascular Disease

## 2019-06-13 ENCOUNTER — Other Ambulatory Visit: Payer: Self-pay

## 2019-06-13 DIAGNOSIS — I5032 Chronic diastolic (congestive) heart failure: Secondary | ICD-10-CM | POA: Diagnosis present

## 2019-06-14 ENCOUNTER — Ambulatory Visit (INDEPENDENT_AMBULATORY_CARE_PROVIDER_SITE_OTHER): Payer: No Typology Code available for payment source | Admitting: Cardiovascular Disease

## 2019-06-14 ENCOUNTER — Encounter: Payer: Self-pay | Admitting: Cardiovascular Disease

## 2019-06-14 VITALS — BP 120/62 | HR 51 | Ht 68.0 in | Wt 163.0 lb

## 2019-06-14 DIAGNOSIS — I25810 Atherosclerosis of coronary artery bypass graft(s) without angina pectoris: Secondary | ICD-10-CM

## 2019-06-14 DIAGNOSIS — R0602 Shortness of breath: Secondary | ICD-10-CM | POA: Diagnosis not present

## 2019-06-14 DIAGNOSIS — I480 Paroxysmal atrial fibrillation: Secondary | ICD-10-CM

## 2019-06-14 DIAGNOSIS — I10 Essential (primary) hypertension: Secondary | ICD-10-CM | POA: Diagnosis not present

## 2019-06-14 DIAGNOSIS — I5032 Chronic diastolic (congestive) heart failure: Secondary | ICD-10-CM | POA: Diagnosis not present

## 2019-06-14 DIAGNOSIS — I739 Peripheral vascular disease, unspecified: Secondary | ICD-10-CM

## 2019-06-14 MED ORDER — FUROSEMIDE 20 MG PO TABS: ORAL_TABLET | ORAL | 2 refills | Status: AC

## 2019-06-14 NOTE — Patient Instructions (Signed)
Medication Instructions:  Increase Lasix to 40 mg for 3 days, then resume 20 mg daily   *If you need a refill on your cardiac medications before your next appointment, please call your pharmacy*  Follow-Up: At [facility], you and your health needs are our priority.  As part of our continuing mission to provide you with exceptional heart care, we have created designated Provider Care Teams.  These Care Teams include your primary Cardiologist (physician) and Advanced Practice Providers (APPs -  Physician Assistants and Nurse Practitioners) who all work together to provide you with the care you need, when you need it.  We recommend signing up for the patient portal called "MyChart".  Sign up information is provided on this After Visit Summary.  MyChart is used to connect with patients for Virtual Visits (Telemedicine).  Patients are able to view lab/test results, encounter notes, upcoming appointments, etc.  Non-urgent messages can be sent to your provider as well.   To learn more about what you can do with MyChart, go to [email or web address].    Your next appointment:   PRN  The format for your next appointment:   In Person  Provider:   Lennie Odor, MD

## 2019-06-29 ENCOUNTER — Telehealth: Payer: Self-pay | Admitting: Cardiovascular Disease

## 2019-06-29 NOTE — Telephone Encounter (Signed)
Called patient- spoke to him and advised that I did not see anything for an order for an MRI from Dr.O'Neal- but we will see what the [facility] said if they wanted to [facility] it or not- since his cath, he said it was mentioned by Dr.Berry to have a Cardiac MRI.  Patient will return call back with any changes.

## 2019-06-29 NOTE — Telephone Encounter (Signed)
Patient called because he got an approval letter from the [facility] in regards to scheduling an MRI. I do not see an order in the system but he gave me a referral code: [id number].  He states the letter gave [facility] [address] address and [address] number. He doesn't think this has anything to do with Dr. Flora Lipps but I'm not sure where to go from here. He is calling the [facility] to see if he can get it figured out.

## 2019-07-04 ENCOUNTER — Telehealth: Payer: Self-pay

## 2019-07-04 NOTE — Telephone Encounter (Signed)
NOTES ON FILE FROM [phone] EXT [phone] SENT REFERRAL TO SCHEDULING

## 2019-07-05 ENCOUNTER — Telehealth: Payer: Self-pay | Admitting: Cardiovascular Disease

## 2019-07-05 NOTE — Telephone Encounter (Signed)
   Pt is calling back and looking for Andre Warren, he said it's regarding his MRI from previous concern last 06/29/19 and would like to discuss it with her  Please call

## 2019-07-05 NOTE — Telephone Encounter (Signed)
Routed to [place] LPN I do not see an MRI ordered

## 2019-07-06 NOTE — Telephone Encounter (Signed)
Called and spoke with pt, states that [facility] has [facility] and they stated they have faxed the paperwork. He states that the [place] sent him a letter with [facility]'s address at [address] with Dr.Berry's name on it as well from his cath.  Pt stated he will call his cardiologist at the [facility] again to get this straightened out.  I notified that if they do want to fax something to have them send it to the [facility] office. Pt verbalized understanding. Will route to primary RN to make aware

## 2019-07-06 NOTE — Telephone Encounter (Signed)
Patient following up. He states he needs to speak with Raynelle Fanning in regards to scheduling the cardiac MRI.

## 2019-07-08 NOTE — Telephone Encounter (Signed)
Noted! Thank you

## 2019-07-11 ENCOUNTER — Other Ambulatory Visit: Payer: Self-pay

## 2019-07-11 ENCOUNTER — Telehealth: Payer: Self-pay

## 2019-07-11 DIAGNOSIS — I5032 Chronic diastolic (congestive) heart failure: Secondary | ICD-10-CM

## 2019-07-11 NOTE — Telephone Encounter (Signed)
Called patient- received fax from [place] wanting the Cardiac MRI- per Dr.O'Neal okay to order, ordered MRI and blood work to be completed.  Patient is aware, no questions.

## 2019-07-19 ENCOUNTER — Encounter: Payer: Self-pay | Admitting: Cardiovascular Disease

## 2019-07-19 ENCOUNTER — Telehealth: Payer: Self-pay | Admitting: Cardiovascular Disease

## 2019-07-19 NOTE — Telephone Encounter (Signed)
Spoke with patient regarding appointment for Cardiac MRI scheduled 08/24/19 at 9:00 am at [facility]--arrival time is 8:15 am 1st floor admissions office.  Will mail information to patient and it is also in My Chart.  Patient voiced his understanding.

## 2019-08-18 ENCOUNTER — Emergency Department (HOSPITAL_COMMUNITY): Payer: No Typology Code available for payment source

## 2019-08-18 ENCOUNTER — Emergency Department (HOSPITAL_COMMUNITY)
Admission: EM | Admit: 2019-08-18 | Discharge: 2019-08-19 | Disposition: A | Discharge status: Home / Self Care | Payer: No Typology Code available for payment source | Attending: Emergency Medicine | Admitting: Emergency Medicine

## 2019-08-18 ENCOUNTER — Encounter (HOSPITAL_COMMUNITY): Payer: Self-pay | Admitting: Emergency Medicine

## 2019-08-18 DIAGNOSIS — Y929 Unspecified place or not applicable: Secondary | ICD-10-CM | POA: Insufficient documentation

## 2019-08-18 DIAGNOSIS — Z7901 Long term (current) use of anticoagulants: Secondary | ICD-10-CM | POA: Diagnosis not present

## 2019-08-18 DIAGNOSIS — E1122 Type 2 diabetes mellitus with diabetic chronic kidney disease: Secondary | ICD-10-CM | POA: Diagnosis not present

## 2019-08-18 DIAGNOSIS — R609 Edema, unspecified: Secondary | ICD-10-CM

## 2019-08-18 DIAGNOSIS — M4802 Spinal stenosis, cervical region: Secondary | ICD-10-CM

## 2019-08-18 DIAGNOSIS — M7989 Other specified soft tissue disorders: Secondary | ICD-10-CM | POA: Insufficient documentation

## 2019-08-18 DIAGNOSIS — Y939 Activity, unspecified: Secondary | ICD-10-CM | POA: Diagnosis not present

## 2019-08-18 DIAGNOSIS — N182 Chronic kidney disease, stage 2 (mild): Secondary | ICD-10-CM | POA: Insufficient documentation

## 2019-08-18 DIAGNOSIS — I129 Hypertensive chronic kidney disease with stage 1 through stage 4 chronic kidney disease, or unspecified chronic kidney disease: Secondary | ICD-10-CM | POA: Insufficient documentation

## 2019-08-18 DIAGNOSIS — S8001XA Contusion of right knee, initial encounter: Secondary | ICD-10-CM | POA: Diagnosis not present

## 2019-08-18 DIAGNOSIS — Z79899 Other long term (current) drug therapy: Secondary | ICD-10-CM | POA: Diagnosis not present

## 2019-08-18 DIAGNOSIS — T148XXA Other injury of unspecified body region, initial encounter: Secondary | ICD-10-CM

## 2019-08-18 DIAGNOSIS — W1839XA Other fall on same level, initial encounter: Secondary | ICD-10-CM | POA: Diagnosis not present

## 2019-08-18 DIAGNOSIS — W19XXXA Unspecified fall, initial encounter: Secondary | ICD-10-CM

## 2019-08-18 DIAGNOSIS — N289 Disorder of kidney and ureter, unspecified: Secondary | ICD-10-CM

## 2019-08-18 DIAGNOSIS — Y999 Unspecified external cause status: Secondary | ICD-10-CM | POA: Insufficient documentation

## 2019-08-18 DIAGNOSIS — Z23 Encounter for immunization: Secondary | ICD-10-CM | POA: Diagnosis not present

## 2019-08-18 DIAGNOSIS — I2510 Atherosclerotic heart disease of native coronary artery without angina pectoris: Secondary | ICD-10-CM | POA: Insufficient documentation

## 2019-08-18 LAB — BASIC METABOLIC PANEL
Anion gap: 13 (ref 5–15)
BUN: 24 mg/dL — ABNORMAL HIGH (ref 8–23)
CO2: 20 mmol/L — ABNORMAL LOW (ref 22–32)
Calcium: 10 mg/dL (ref 8.9–10.3)
Chloride: 103 mmol/L (ref 98–111)
Creatinine, Ser: 1.9 mg/dL — ABNORMAL HIGH (ref 0.61–1.24)
GFR calc Af Amer: 40 mL/min — ABNORMAL LOW (ref >60)
GFR calc non Af Amer: 34 mL/min — ABNORMAL LOW (ref >60)
Glucose, Bld: 110 mg/dL — ABNORMAL HIGH (ref 70–99)
Potassium: 3.8 mmol/L (ref 3.5–5.1)
Sodium: 136 mmol/L (ref 135–145)

## 2019-08-18 LAB — CBC
HCT: 36.3 % — ABNORMAL LOW (ref 39.0–52.0)
Hemoglobin: 12 g/dL — ABNORMAL LOW (ref 13.0–17.0)
MCH: 33.1 pg (ref 26.0–34.0)
MCHC: 33.1 g/dL (ref 30.0–36.0)
MCV: 100 fL (ref 80.0–100.0)
Platelets: 160 10*3/uL (ref 150–400)
RBC: 3.63 MIL/uL — ABNORMAL LOW (ref 4.22–5.81)
RDW: 14.2 % (ref 11.5–15.5)
WBC: 10.9 10*3/uL — ABNORMAL HIGH (ref 4.0–10.5)
nRBC: 0 % (ref 0.0–0.2)

## 2019-08-18 LAB — TROPONIN I (HIGH SENSITIVITY): Troponin I (High Sensitivity): 7 ng/L (ref <18)

## 2019-08-18 MED ORDER — SODIUM CHLORIDE 0.9% FLUSH: 3.0000 mL | Freq: Once | INTRAVENOUS | Status: DC

## 2019-08-18 MED ORDER — BACITRACIN ZINC 500 UNIT/GM EX OINT
TOPICAL_OINTMENT | Freq: Two times a day (BID) | CUTANEOUS | Status: DC
  Filled 2019-08-18: qty 0.9

## 2019-08-18 MED ORDER — TETANUS-DIPHTH-ACELL PERTUSSIS 5-2.5-18.5 LF-MCG/0.5 IM SUSP
0.5000 mL | Freq: Once | INTRAMUSCULAR | Status: AC
  Administered 2019-08-18: 0.5 mL via INTRAMUSCULAR
  Filled 2019-08-18: qty 0.5

## 2019-08-18 MED ORDER — LIDOCAINE 5 % EX PTCH
1.0000 | MEDICATED_PATCH | CUTANEOUS | Status: DC
  Administered 2019-08-18: 1 via TRANSDERMAL
  Filled 2019-08-18: qty 1

## 2019-08-18 NOTE — ED Provider Notes (Signed)
[facility] EMERGENCY DEPARTMENT Provider Note   CSN: [id number] Arrival date & time: 08/18/19  2207     History Chief Complaint  Patient presents with  . Fall    Andre Warren is a 74 y.o. male.  The history is provided by the patient.  Fall This is a new problem. The current episode started 3 to 5 hours ago. The problem occurs constantly. The problem has not changed since onset.Pertinent negatives include no chest pain, no abdominal pain and no headaches. Nothing aggravates the symptoms. Nothing relieves the symptoms. He has tried nothing for the symptoms. The treatment provided no relief.  Patient with chronic SOB and h/o AFIB on eliquis who sees cardiology presents with fall on concrete earlier in the day after having a misstep. Patient is also concerned about ongoing leg swelling B that is non-pitting.  No CP.  No exertional symptoms.  Patient denies changes in breathing.  His R knee is swollen post fall on blood thinners.  Unknown last tetanus      Past Medical History:  Diagnosis Date  . CAD (coronary artery disease)   . Carotid artery occlusion   . CKD (chronic kidney disease) stage 2, GFR 60-89 ml/min   . H/O agent Orange exposure   . Hypercholesteremia   . Hypertension   . Peripheral arterial disease (HCC)    LCEA  . Subdural hematoma, post-traumatic (HCC) 03/2012   Saw Dr. Danielle Dess has been released.     Patient Active Problem List   Diagnosis Date Noted  . Chronic diastolic heart failure (HCC)   . Hx of CABG   . Acute ST elevation myocardial infarction (STEMI) of inferior wall (HCC) 09/03/2017  . Acute myocardial infarction (HCC)   . H/O carotid artery stenosis 03/21/2014  . CKD (chronic kidney disease) stage 2, GFR 60-89 ml/min 03/21/2014  . Prediabetes 05/08/2013  . Vitamin D deficiency 05/08/2013  . Medication management 05/08/2013  . Hypertension   . CAD (coronary artery disease) 03/15/2012  . Hyperlipidemia 03/15/2012    Past  Surgical History:  Procedure Laterality Date  . CAROTID ENDARTERECTOMY  2004  . CORONARY ANGIOPLASTY WITH STENT PLACEMENT     Became occluded and resulted in CABG  . CORONARY ARTERY BYPASS GRAFT     x 3  . LEFT HEART CATH AND CORS/GRAFTS ANGIOGRAPHY N/A 09/03/2017   Procedure: LEFT HEART CATH AND CORS/GRAFTS ANGIOGRAPHY;  Surgeon: Kathleene Hazel, MD;  Location: [facility] INVASIVE CV LAB;  Service: Cardiovascular;  Laterality: N/A;  . RIGHT/LEFT HEART CATH AND CORONARY/GRAFT ANGIOGRAPHY N/A 05/30/2019   Procedure: RIGHT/LEFT HEART CATH AND CORONARY/GRAFT ANGIOGRAPHY;  Surgeon: Runell Gess, MD;  Location: [facility] INVASIVE CV LAB;  Service: Cardiovascular;  Laterality: N/A;       Family History  Problem Relation Age of Onset  . Hypertension Mother   . Heart disease Mother   . Hypertension Father   . Stroke Father   . Heart disease Father   . Cancer Father   . Cancer Sister   . Heart disease Sister     Social History   Tobacco Use  . Smoking status: Never Smoker  . Smokeless tobacco: Never Used  Vaping Use  . Vaping Use: Never used  Substance Use Topics  . Alcohol use: No  . Drug use: No    Home Medications Prior to Admission medications   Medication Sig Start Date End Date Taking? Authorizing Provider  amLODipine (NORVASC) 10 MG tablet Take 10 mg by mouth  daily.    [provider]  apixaban (ELIQUIS) 5 MG TABS tablet Take 1 tablet (5 mg total) by mouth 2 (two) times daily. 09/06/17   Strader, Lennart Pall, PA-C  atorvastatin (LIPITOR) 80 MG tablet Take 1 tablet (80 mg total) by mouth daily. 09/06/17   Strader, Lennart Pall, PA-C  BABY ASPIRIN PO Take 81 mg by mouth daily.    [provider]  furosemide (LASIX) 20 MG tablet Take 2 tablets (40 mg) for 3 days, then resume 1 table (20 mg) daily 06/14/19   O'Neal, Ronnald Ramp, MD  metoprolol tartrate (LOPRESSOR) 25 MG tablet Take 1 tablet (25 mg total) by mouth 2 (two) times daily. 09/06/17   Strader, Lennart Pall,  PA-C  pantoprazole (PROTONIX) 40 MG tablet Take 40 mg by mouth daily.    [provider]    Allergies    Latex  Review of Systems   Review of Systems  Constitutional: Negative for fever.  HENT: Negative for congestion.   Eyes: Negative for visual disturbance.  Respiratory: Negative for chest tightness.   Cardiovascular: Positive for leg swelling. Negative for chest pain and palpitations.  Gastrointestinal: Negative for abdominal pain and vomiting.  Genitourinary: Negative for difficulty urinating.  Musculoskeletal: Positive for arthralgias.  Skin: Negative for rash.  Neurological: Negative for weakness, numbness and headaches.  Psychiatric/Behavioral: Negative for confusion.  All other systems reviewed and are negative.   Physical Exam Updated Vital Signs BP (!) 144/74 (BP Location: Left Arm)   Pulse 71   Temp 98.1 F (36.7 C) (Oral)   Resp 17   SpO2 97%   Physical Exam Vitals and nursing note reviewed.  Constitutional:      General: He is not in acute distress.    Appearance: Normal appearance.  HENT:     Head: Normocephalic and atraumatic.     Nose: Nose normal.  Eyes:     Conjunctiva/sclera: Conjunctivae normal.     Pupils: Pupils are equal, round, and reactive to light.  Cardiovascular:     Rate and Rhythm: Normal rate and regular rhythm.     Pulses: Normal pulses.     Heart sounds: Normal heart sounds.  Pulmonary:     Effort: Pulmonary effort is normal. No respiratory distress.     Breath sounds: Normal breath sounds. No stridor. No wheezing, rhonchi or rales.  Chest:     Chest wall: No tenderness.  Abdominal:     General: Abdomen is flat. Bowel sounds are normal.     Tenderness: There is no abdominal tenderness. There is no guarding.  Musculoskeletal:     Cervical back: Normal range of motion and neck supple.     Right hip: Normal.     Left hip: Normal.     Right upper leg: Normal.     Left upper leg: Normal.     Right knee: Swelling and  ecchymosis present. No crepitus. No LCL laxity, MCL laxity, ACL laxity or PCL laxity. Normal alignment, normal meniscus and normal patellar mobility. Normal pulse.     Instability Tests: Anterior drawer test negative. Posterior drawer test negative.     Right lower leg: Edema present.     Left lower leg: Edema present.     Right ankle:     Right Achilles Tendon: Normal.     Left ankle:     Left Achilles Tendon: Normal.     Right foot: Normal.     Left foot: Normal.  Legs:     Comments: Non-pitting edema to the tibial plateau B     Skin:    General: Skin is warm and dry.     Capillary Refill: Capillary refill takes less than 2 seconds.  Neurological:     General: No focal deficit present.     Mental Status: He is alert and oriented to person, place, and time.     Deep Tendon Reflexes: Reflexes normal.  Psychiatric:        Mood and Affect: Mood normal.        Behavior: Behavior normal.     ED Results / Procedures / Treatments   Labs (all labs ordered are listed, but only abnormal results are displayed) Labs Reviewed  BASIC METABOLIC PANEL  CBC  BRAIN NATRIURETIC PEPTIDE  TROPONIN I (HIGH SENSITIVITY)    EKG EKG Interpretation  Date/Time:  Thursday August 18 2019 22:22:04 EDT Ventricular Rate:  65 PR Interval:    QRS Duration: 78 QT Interval:  410 QTC Calculation: 426 R Axis:   47 Text Interpretation: Sinus rhythm with PAC Confirmed by Nicanor Alcon, Tylan Kinn ([id number]) on 08/18/2019 11:01:36 PM   Radiology No results found.  Procedures Procedures (including critical care time)  Medications Ordered in ED Medications  sodium chloride flush (NS) 0.9 % injection 3 mL (has no administration in time range)  lidocaine (LIDODERM) 5 % 1 patch (1 patch Transdermal Patch Applied 08/18/19 2329)  bacitracin ointment ( Topical Given 08/18/19 2351)  Tdap (BOOSTRIX) injection 0.5 mL (0.5 mLs Intramuscular Given 08/18/19 2351)  acetaminophen (TYLENOL) tablet 1,000 mg (1,000 mg Oral  Given 08/19/19 0044)  diclofenac Sodium (VOLTAREN) 1 % topical gel 4 g (4 g Topical Given 08/19/19 0044)    ED Course  I have reviewed the triage vital signs and the nursing notes.  Pertinent labs & imaging results that were available during my care of the patient were reviewed by me and considered in my medical decision making (see chart for details).  The edema is non-pitting and dependent in nature.  There is almost certainly a component from the Norvasc which is 10 mg, as this medication is known to cause edema at this dose.  Have set up outpatient dopplers of the BLE for the am.  I have informed family that these are not available in the evening.  I have also ordered compression hose to be worn.  I will refer to orthopedics for knee swelling on eliquis post fall.  All compartments are soft and there are no signs of compartment syndrome.  Tetanus was given in the ED and the abrasion was cleaned and bacitracin applied.   I do not think narcotics are a good idea in a patient with falls who is on blood thinners.  I will refer back to PMD to discuss PTSD that family says is secondary to pain.  I am hopeful that decreasing norvasc and starting compression hose will help with this.   Patient to be be ruled out for DVT in the am but I have a low suspicion of this.  I had a long discussion (greater than 10 minutes with nurse present) with the patient and family regarding test results, treatment options and goals and follow up.    Final Clinical Impression(s) / ED Diagnoses Return for intractable cough, coughing up blood,fevers >100.4 unrelieved by medication, shortness of breath, intractable vomiting, chest pain, shortness of breath, weakness,numbness, changes in speech, facial asymmetry,abdominal pain, passing out,Inability to tolerate liquids or food, cough, altered mental status  or any concerns. No signs of systemic illness or infection. The patient is nontoxic-appearing on exam and vital signs  are within normal limits.   I have reviewed the triage vital signs and the nursing notes. Pertinent labs &imaging results that were available during my care of the patient were reviewed by me and considered in my medical decision making (see chart for details).After history, exam, and medical workup I feel the patient has beenappropriately medically screened and is safe for discharge home. Pertinent diagnoses were discussed with the patient. Patient was given return precautions.    Brennan Litzinger, MD 08/19/19 3668

## 2019-08-18 NOTE — ED Triage Notes (Signed)
Per patient and son at bedside pt fell onto concrete stoop earlier today while helping hang a screen door. Pt is on Eliquis. RLE ext swollen with hematoma below knee.

## 2019-08-18 NOTE — ED Notes (Signed)
Patient is resting comfortably. 

## 2019-08-18 NOTE — ED Notes (Signed)
Patient transported to X-ray 

## 2019-08-19 ENCOUNTER — Other Ambulatory Visit: Payer: Self-pay

## 2019-08-19 ENCOUNTER — Ambulatory Visit (HOSPITAL_COMMUNITY)
Admission: RE | Admit: 2019-08-19 | Discharge: 2019-08-19 | Disposition: A | Discharge status: Home / Self Care | Payer: Medicare Other | Source: Ambulatory Visit | Attending: Emergency Medicine | Admitting: Emergency Medicine

## 2019-08-19 DIAGNOSIS — M79609 Pain in unspecified limb: Secondary | ICD-10-CM | POA: Diagnosis not present

## 2019-08-19 DIAGNOSIS — M7989 Other specified soft tissue disorders: Secondary | ICD-10-CM

## 2019-08-19 DIAGNOSIS — M79604 Pain in right leg: Secondary | ICD-10-CM | POA: Diagnosis present

## 2019-08-19 LAB — TROPONIN I (HIGH SENSITIVITY): Troponin I (High Sensitivity): 6 ng/L (ref <18)

## 2019-08-19 LAB — BRAIN NATRIURETIC PEPTIDE: B Natriuretic Peptide: 218.1 pg/mL — ABNORMAL HIGH (ref 0.0–100.0)

## 2019-08-19 MED ORDER — ACETAMINOPHEN 500 MG PO TABS
1000.0000 mg | ORAL_TABLET | Freq: Once | ORAL | Status: AC
  Administered 2019-08-19: 1000 mg via ORAL
  Filled 2019-08-19: qty 2

## 2019-08-19 MED ORDER — DICLOFENAC SODIUM 1 % EX GEL: 4.0000 g | Freq: Four times a day (QID) | CUTANEOUS | 0 refills | Status: AC

## 2019-08-19 MED ORDER — LIDOCAINE 5 % EX PTCH: 1.0000 | MEDICATED_PATCH | CUTANEOUS | 0 refills | Status: AC

## 2019-08-19 MED ORDER — DICLOFENAC SODIUM 1 % EX GEL
4.0000 g | CUTANEOUS | Status: AC
  Administered 2019-08-19: 4 g via TOPICAL
  Filled 2019-08-19: qty 100

## 2019-08-19 NOTE — Discharge Instructions (Signed)
2 tylenol every 6 hours as needed for pain.  Ice and elevate BLE.  Wear your compression hose all day.

## 2019-08-22 ENCOUNTER — Other Ambulatory Visit: Payer: Self-pay

## 2019-08-22 DIAGNOSIS — I5032 Chronic diastolic (congestive) heart failure: Secondary | ICD-10-CM

## 2019-08-22 LAB — BASIC METABOLIC PANEL
BUN/Creatinine Ratio: 11 (ref 10–24)
BUN: 17 mg/dL (ref 8–27)
CO2: 22 mmol/L (ref 20–29)
Calcium: 10.6 mg/dL — ABNORMAL HIGH (ref 8.6–10.2)
Chloride: 103 mmol/L (ref 96–106)
Creatinine, Ser: 1.51 mg/dL — ABNORMAL HIGH (ref 0.76–1.27)
GFR calc Af Amer: 52 mL/min/{1.73_m2} — ABNORMAL LOW (ref >59)
GFR calc non Af Amer: 45 mL/min/{1.73_m2} — ABNORMAL LOW (ref >59)
Glucose: 113 mg/dL — ABNORMAL HIGH (ref 65–99)
Potassium: 4.8 mmol/L (ref 3.5–5.2)
Sodium: 136 mmol/L (ref 134–144)

## 2019-08-23 ENCOUNTER — Telehealth (HOSPITAL_COMMUNITY): Payer: Self-pay | Admitting: *Deleted

## 2019-08-23 NOTE — Telephone Encounter (Signed)
Reaching out to patient to offer assistance regarding upcoming cardiac imaging study; pt verbalizes understanding of appt date/time, parking situation and where to check in, and verified current allergies; name and call back number provided for further questions should they arise  Jaxyn Rout Tai RN Navigator Cardiac Imaging [facility] [phone] office [phone] cell 

## 2019-08-24 ENCOUNTER — Other Ambulatory Visit: Payer: Self-pay

## 2019-08-24 ENCOUNTER — Ambulatory Visit (HOSPITAL_COMMUNITY)
Admission: RE | Admit: 2019-08-24 | Discharge: 2019-08-24 | Disposition: A | Discharge status: Home / Self Care | Payer: No Typology Code available for payment source | Source: Ambulatory Visit | Attending: Cardiovascular Disease | Admitting: Cardiovascular Disease

## 2019-08-24 DIAGNOSIS — I5032 Chronic diastolic (congestive) heart failure: Secondary | ICD-10-CM

## 2019-08-24 MED ORDER — GADOBUTROL 1 MMOL/ML IV SOLN
8.0000 mL | Freq: Once | INTRAVENOUS | Status: AC | PRN
  Administered 2019-08-24: 8 mL via INTRAVENOUS

## 2019-11-16 ENCOUNTER — Telehealth: Payer: Self-pay | Admitting: Cardiovascular Disease

## 2019-11-16 NOTE — Telephone Encounter (Signed)
      I went in pt's chart to see when pt needs his next office visit

## 2020-02-22 ENCOUNTER — Encounter (HOSPITAL_COMMUNITY): Payer: Self-pay | Admitting: *Deleted

## 2020-02-22 NOTE — Progress Notes (Signed)
Received [facility] Authorization [id number] from Dr. Andee Poles for this pt to participate in cardiac rehab with the diagnosis of Dyspnea.  Pt with prior history of CABG in 2004 and MI Stent to RCA in 2019 which he completed cardiac rehab.  Questioned whether pt goals could be achieved in the pulmonary rehab program.  Advised by community care provider that his pulmonary work up was negative - PFT did not show any restrictive lung disease. Therefore it is felt Mr. Eckard would benefit from cardiac rehab.Clinical review of accompanying documents - primary clinic note from 11/29 and cardiology clinic note from 09/2019.  Pt seen by local cardiologist Dr Gerri Spore in April 2021. Pt had Cardiac cath in March 2021 which showed unchanged anatomy. Pt had Cardia  MRI in June 2021. Pt is making the expected progress in recovery.  Pt appropriate for scheduling for on site cardiac rehab and/or enrollment in Virtual Cardiac Rehab.  Pt Covid Risk Score is 6.  Will forward to staff for follow up. Alanson Aly, BSN Cardiac and Emergency planning/management officer

## 2020-02-23 ENCOUNTER — Telehealth (HOSPITAL_COMMUNITY): Payer: Self-pay

## 2020-02-23 NOTE — Telephone Encounter (Signed)
Called patient to see if he was interested in participating in the Cardiac Rehab Program. Patient stated yes. Patient will come in for orientation on 03/29/20 @ 1:15PM and will attend the 3:15PM exercise class.  Pensions consultant.

## 2020-03-13 ENCOUNTER — Telehealth (HOSPITAL_COMMUNITY): Payer: Self-pay | Admitting: Pharmacist

## 2020-03-13 NOTE — Telephone Encounter (Signed)
Cardiac Rehab Medication Review by a Pharmacist  Does the patient feel that his/her medications are working for him/her?  No  Has the patient been experiencing any side effects to the medications prescribed?  Patient is experiencing significant shortness of breath despite extensive medical workup   No side effects so far from medications -- pt is concerned that amlodipine may be causing leg swelling. Patient has ordered compression stockings but they haven't come yet.   Does the patient measure his/her own blood pressure or blood glucose at home?  Yes, patient takes it 2-3 times per week with blood pressure cuff Usually runs between 107-125/55-69  Does the patient have any problems obtaining medications due to transportation or finances?   No  Understanding of regimen: good Understanding of indications: good Potential of compliance: good   Calton Dach, PharmD PGY1 Pharmacy Resident 03/13/2020 10:28 AM

## 2020-03-27 ENCOUNTER — Encounter (HOSPITAL_COMMUNITY)
Admission: RE | Admit: 2020-03-27 | Discharge: 2020-03-27 | Disposition: A | Discharge status: Home / Self Care | Payer: No Typology Code available for payment source | Source: Ambulatory Visit | Attending: Cardiology | Admitting: Cardiology

## 2020-03-27 ENCOUNTER — Other Ambulatory Visit: Payer: Self-pay

## 2020-03-27 DIAGNOSIS — I5032 Chronic diastolic (congestive) heart failure: Secondary | ICD-10-CM | POA: Insufficient documentation

## 2020-03-27 NOTE — Progress Notes (Signed)
Cardiac Rehab Telephone Note:  Successful telephone encounter to Colon Flattery to confirm Cardiac Rehab orientation appointment for 03/29/20 at 1:15. Nursing assessment completed. Patient questions answered. Instructions for appointment provided. Patient screening for Covid-19 negative.  Braddock Servellon E. Suzie Portela RN, BSN [place]  Cardiac and Pulmonary Rehabilitation Phone: [phone] Fax: [phone]

## 2020-03-29 ENCOUNTER — Telehealth (HOSPITAL_COMMUNITY): Payer: Self-pay | Admitting: *Deleted

## 2020-03-29 ENCOUNTER — Other Ambulatory Visit: Payer: Self-pay | Admitting: Cardiology

## 2020-03-29 ENCOUNTER — Encounter (HOSPITAL_COMMUNITY)
Admission: RE | Admit: 2020-03-29 | Discharge: 2020-03-29 | Disposition: A | Discharge status: Home / Self Care | Payer: No Typology Code available for payment source | Source: Ambulatory Visit | Attending: Cardiology | Admitting: Cardiology

## 2020-03-29 ENCOUNTER — Other Ambulatory Visit: Payer: Self-pay

## 2020-03-29 ENCOUNTER — Telehealth: Payer: Self-pay | Admitting: Physician Assistant

## 2020-03-29 VITALS — BP 118/78 | Ht 67.0 in | Wt 153.0 lb

## 2020-03-29 DIAGNOSIS — R0789 Other chest pain: Secondary | ICD-10-CM

## 2020-03-29 DIAGNOSIS — I5032 Chronic diastolic (congestive) heart failure: Secondary | ICD-10-CM

## 2020-03-29 NOTE — Telephone Encounter (Signed)
EKG stable

## 2020-03-29 NOTE — Telephone Encounter (Signed)
At end of 6 min walk pt felt funny in his chest.  It resolved quickly.  He had stable blood flow in March 2021.  His [facility] doctor ordered rehab for SOB.  He will follow up with the [facility] before next rehab visit,

## 2020-03-29 NOTE — Progress Notes (Signed)
Andre Boozer NP reviewed 12 lead ECG and said that the patient can go home if he does not have any symptoms. Will forward event to Dr Armanda Magic MD medical director for review. Left a message with the [facility] cardiology charge nurse voicemail. Will fax exercise flow sheets to Dr. Griselda Miner  office for review at the [facility] with today's ECG tracings, 12 lead ECG and note. Exit blood pressure 144/82 heart rate 61. Patient was reminded to report to his nearest emergency room if he has a reoccurrence of symptoms. Patient states understanding.Gladstone Lighter, RN,BSN 03/29/2020 4:08 PM

## 2020-03-29 NOTE — Telephone Encounter (Signed)
Left message with Mrs Carmickle to call cardiac rehab.Gladstone Lighter, RN,BSN 03/30/2020 8:42 AM

## 2020-03-29 NOTE — Telephone Encounter (Signed)
Andre Warren called to report that he made it home safely. Patient was reminded that he will need to follow up with the [facility] before beginning exercise. Patient states understanding.Gladstone Lighter, RN,BSN 03/29/2020 4:01 PM

## 2020-03-29 NOTE — Progress Notes (Signed)
Patient completed 5 minute 15 seconds of 6 minute walk. EP Lorin Picket noted the patient to be very short of breath. Patient sat down and had a stoic look on his face and slow to respond. Telemetry rhythm showed Sinus Brady, Sinus Rhythm with intermittent PVC's maximum heart rate noted at 62. Patient said that he did not feel right in his chest but was not sure if this was related to his shortness of breath. Oxygen saturation 100% on room air. Blood pressure 150/80 heart rate 68. Patient's mask was taken down so that he could breath better. Patient has PTSD it appeared that the patient may have been having a panic attack. The Patient's feet were elevated recheck BP 126/73 with a heart rate of 53.  Mr Schnyder said that he did not like the windows in the room and started crying. Emotional support provided to the patient by staff. Patient stopped crying said that he was sorry that his had happened and appeared to calm down. Nada Boozer NP paged and notified about today's event. Vernona Rieger reviewed the patient's chart as he was evaluated by Dr Flora Lipps on 4/21. Vernona Rieger ordered a 12 lead ECG and said that the patient could go home if he has not symptoms. Vernona Rieger said that the patient needs to have follow up with the [facility] before beginning exercise. Gladstone Lighter, RN,BSN 03/29/2020 3:58 PM

## 2020-04-02 ENCOUNTER — Ambulatory Visit (HOSPITAL_COMMUNITY): Payer: Medicare Other

## 2020-04-04 ENCOUNTER — Ambulatory Visit (HOSPITAL_COMMUNITY): Payer: No Typology Code available for payment source

## 2020-04-06 ENCOUNTER — Ambulatory Visit (HOSPITAL_COMMUNITY): Payer: No Typology Code available for payment source

## 2020-04-09 ENCOUNTER — Ambulatory Visit (HOSPITAL_COMMUNITY): Payer: No Typology Code available for payment source

## 2020-04-11 ENCOUNTER — Ambulatory Visit (HOSPITAL_COMMUNITY): Payer: No Typology Code available for payment source

## 2020-04-13 ENCOUNTER — Ambulatory Visit (HOSPITAL_COMMUNITY): Payer: No Typology Code available for payment source

## 2020-04-16 ENCOUNTER — Ambulatory Visit (HOSPITAL_COMMUNITY): Payer: No Typology Code available for payment source

## 2020-04-18 ENCOUNTER — Ambulatory Visit (HOSPITAL_COMMUNITY): Payer: No Typology Code available for payment source

## 2020-04-20 ENCOUNTER — Ambulatory Visit (HOSPITAL_COMMUNITY): Payer: No Typology Code available for payment source

## 2020-04-23 ENCOUNTER — Ambulatory Visit (HOSPITAL_COMMUNITY): Payer: No Typology Code available for payment source

## 2020-04-25 ENCOUNTER — Ambulatory Visit (HOSPITAL_COMMUNITY): Payer: No Typology Code available for payment source

## 2020-04-27 ENCOUNTER — Ambulatory Visit (HOSPITAL_COMMUNITY): Payer: No Typology Code available for payment source

## 2020-04-30 ENCOUNTER — Ambulatory Visit (HOSPITAL_COMMUNITY): Payer: No Typology Code available for payment source

## 2020-05-02 ENCOUNTER — Ambulatory Visit (HOSPITAL_COMMUNITY): Payer: No Typology Code available for payment source

## 2020-05-04 ENCOUNTER — Ambulatory Visit (HOSPITAL_COMMUNITY): Payer: No Typology Code available for payment source

## 2020-05-07 ENCOUNTER — Ambulatory Visit (HOSPITAL_COMMUNITY): Payer: No Typology Code available for payment source

## 2020-05-09 ENCOUNTER — Ambulatory Visit (HOSPITAL_COMMUNITY): Payer: No Typology Code available for payment source

## 2020-05-11 ENCOUNTER — Ambulatory Visit (HOSPITAL_COMMUNITY): Payer: No Typology Code available for payment source

## 2020-05-14 ENCOUNTER — Ambulatory Visit (HOSPITAL_COMMUNITY): Payer: No Typology Code available for payment source

## 2020-05-16 ENCOUNTER — Ambulatory Visit (HOSPITAL_COMMUNITY): Payer: No Typology Code available for payment source

## 2020-05-18 ENCOUNTER — Ambulatory Visit (HOSPITAL_COMMUNITY): Payer: No Typology Code available for payment source

## 2020-05-21 ENCOUNTER — Ambulatory Visit (HOSPITAL_COMMUNITY): Payer: No Typology Code available for payment source

## 2020-05-23 ENCOUNTER — Ambulatory Visit (HOSPITAL_COMMUNITY): Payer: No Typology Code available for payment source

## 2020-05-25 ENCOUNTER — Ambulatory Visit (HOSPITAL_COMMUNITY): Payer: No Typology Code available for payment source

## 2021-05-11 IMAGING — MR MR CARD MORPHOLOGY WO/W CM
44 of 48 series · 44 of 48 positions shown · IV contrast (gadavist)
Comparison: none

CLINICAL DATA: Chronic diastolic heart failure

EXAM:
CARDIAC MRI
TECHNIQUE: The patient was scanned on a 1.5 Tesla GE magnet. A dedicated
cardiac coil was used. Functional imaging was done using Fiesta
sequences. [DATE], and 4 chamber views were done to assess for RWMA's.
Modified Mhrez rule using a short axis stack was used to
calculate an ejection fraction on a dedicated work station using
Circle software. The patient received 10 cc of Gadavist. After 10
minutes inversion recovery sequences were used to assess for
infiltration and scar tissue.
CONTRAST:  10 cc  of Gadavist

[Series 4: [id number]_bh · axial · 8.0mm · 1.33mm/px · 1 of 16 slices shown]
[im 1/16]
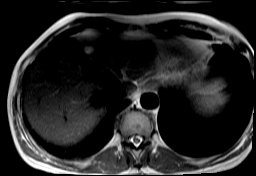

[Series 8: bSSFP · oblique · 8.0mm · 1.61mm/px · 1 of 25 slices shown (1 of 25)]
[im 1/25]
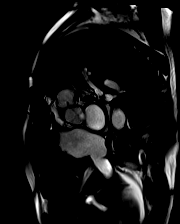

[Series 9: bSSFP · oblique · 8.0mm · 1.61mm/px · 1 of 25 slices shown (2 of 25)]
[im 1/25]
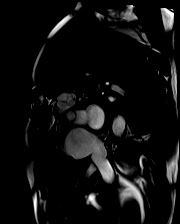

[Series 10: bSSFP · oblique · 8.0mm · 1.61mm/px · 1 of 25 slices shown (3 of 25)]
[im 1/25]
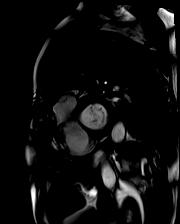

[Series 11: bSSFP · oblique · 8.0mm · 1.61mm/px · 1 of 25 slices shown (4 of 25)]
[im 1/25]
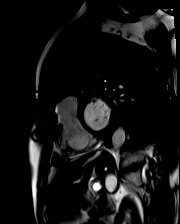

[Series 12: bSSFP · oblique · 8.0mm · 1.61mm/px · 1 of 25 slices shown (5 of 25)]
[im 1/25]
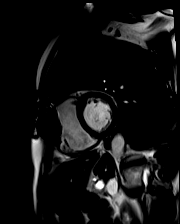

[Series 13: bSSFP · oblique · 8.0mm · 1.61mm/px · 1 of 25 slices shown (6 of 25)]
[im 1/25]
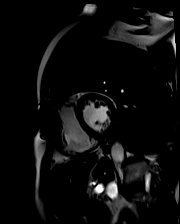

[Series 14: bSSFP · oblique · 8.0mm · 1.61mm/px · 1 of 25 slices shown (7 of 25)]
[im 1/25]
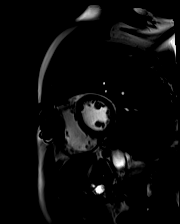

[Series 15: bSSFP · oblique · 8.0mm · 1.61mm/px · 1 of 25 slices shown (8 of 25)]
[im 1/25]
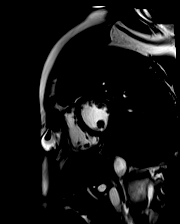

[Series 16: bSSFP · oblique · 8.0mm · 1.61mm/px · 1 of 25 slices shown (9 of 25)]
[im 1/25]
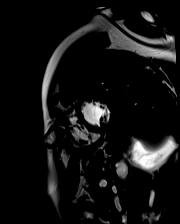

[Series 17: bSSFP · oblique · 8.0mm · 1.61mm/px · 1 of 25 slices shown (10 of 25)]
[im 1/25]
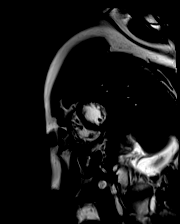

[Series 18: bSSFP · oblique · 8.0mm · 1.61mm/px · 1 of 25 slices shown (11 of 25)]
[im 1/25]
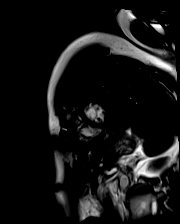

[Series 19: bSSFP · oblique · 8.0mm · 1.61mm/px · 1 of 25 slices shown (12 of 25)]
[im 1/25]
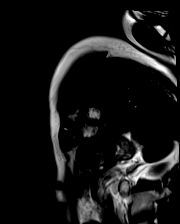

[Series 20: bSSFP · oblique · 8.0mm · 1.61mm/px · 1 of 25 slices shown (13 of 25)]
[im 1/25]
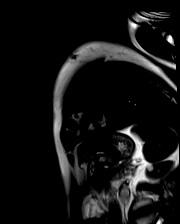

[Series 21: bSSFP · oblique · 8.0mm · 1.61mm/px · 1 of 25 slices shown (14 of 25)]
[im 1/25]
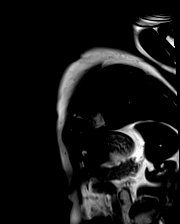

[Series 22: bSSFP · oblique · 8.0mm · 1.61mm/px · 1 of 25 slices shown (15 of 25)]
[im 1/25]
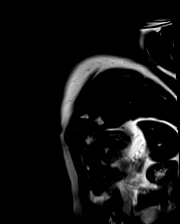

[Series 23: bSSFP · oblique · 8.0mm · 1.61mm/px · 1 of 25 slices shown (16 of 25)]
[im 1/25]
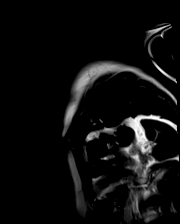

[Series 24: bSSFP · oblique · 6.0mm · 1.41mm/px · 1 of 25 slices shown (17 of 25)]
[im 1/25]
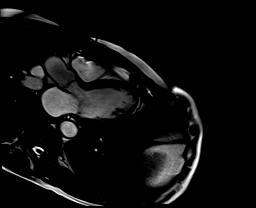

[Series 25: bSSFP · oblique · 6.0mm · 1.41mm/px · 1 of 25 slices shown (18 of 25)]
[im 1/25]
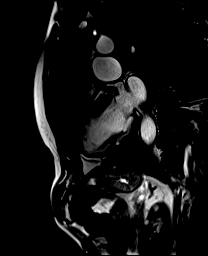

[Series 26: bSSFP · axial · 6.0mm · 1.41mm/px · 1 of 25 slices shown (19 of 25)]
[im 1/25]
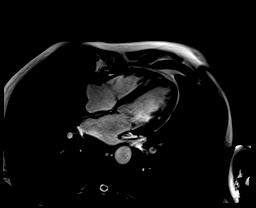

[Series 27: (id)_long_t1 · oblique · 8.0mm · 1.41mm/px · 1 of 24 slices shown]
[im 1/24]
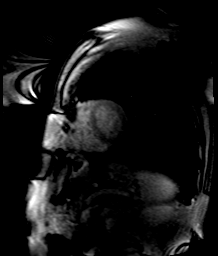

[Series 28: (id)_long_t1_moco · oblique · 8.0mm · 1.41mm/px · 1 of 24 slices shown]
[im 1/24]
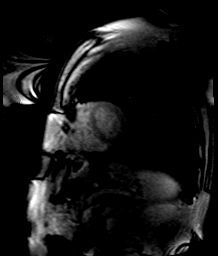

[Series 29: (id)_long_t1_moco_t1 · oblique · 8.0mm · 1.41mm/px · 1 of 6 slices shown]
[im 1/6]
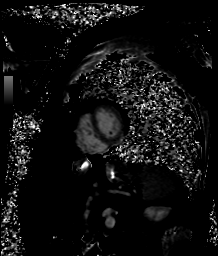

[Series 31: (id)_trufi · oblique · 8.0mm · 1.88mm/px · 1 of 9 slices shown]
[im 1/9]
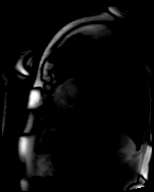

[Series 32: (id)_trufi_moco · oblique · 8.0mm · 1.88mm/px · 1 of 9 slices shown]
[im 1/9]
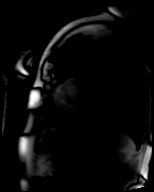

[Series 33: (id)_trufi_moco_t2 · oblique · 8.0mm · 1.88mm/px · 1 of 3 slices shown]
[im 1/3]
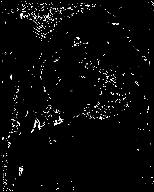

[Series 35: bSSFP · coronal · 6.0mm · 1.41mm/px · 1 of 25 slices shown (20 of 25)]
[im 1/25]
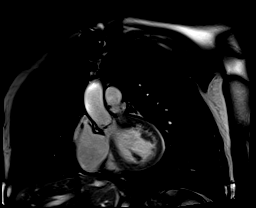

[Series 36: aortic valve cine · oblique · 6.0mm · 1.41mm/px · 1 of 25 slices shown (1 of 3)]
[im 1/25]
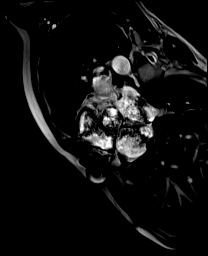

[Series 37: cine rvit · coronal · 6.0mm · 1.41mm/px · 1 of 25 slices shown]
[im 1/25]
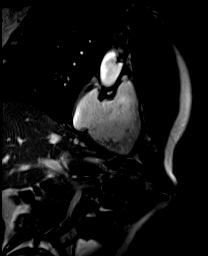

[Series 38: aortic valve cine · oblique · 6.0mm · 1.41mm/px · 1 of 25 slices shown (2 of 3)]
[im 1/25]
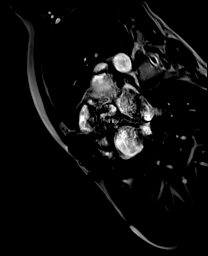

[Series 39: cine rvot · sagittal · 6.0mm · 1.41mm/px · 1 of 25 slices shown]
[im 1/25]
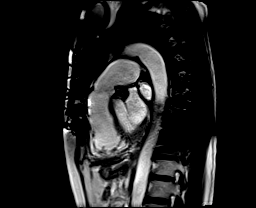

[Series 42: lge_single shot sa · oblique · 8.0mm · 1.98mm/px · 1 of 10 slices shown (1 of 2)]
[im 1/10]
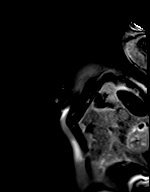

[Series 43: lge_single shot sa · oblique · 8.0mm · 1.98mm/px · 1 of 10 slices shown (2 of 2)]
[im 1/10]
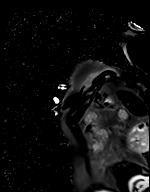

[Series 44: aortic valve cine · oblique · 6.0mm · 1.41mm/px · 1 of 25 slices shown (3 of 3)]
[im 1/25]
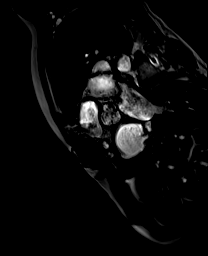

[Series 47: lge_single shot 4 · axial · 6.0mm · 1.98mm/px · 1 of 1 slices shown (1 of 2)]
[im 1/1]
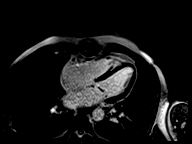

[Series 48: lge_single shot 4 · axial · 6.0mm · 1.98mm/px · 1 of 1 slices shown (2 of 2)]
[im 1/1]
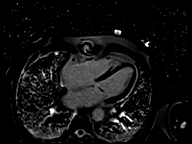

[Series 51: (id)_short_t1 · oblique · 8.0mm · 1.41mm/px · 1 of 27 slices shown]
[im 1/27]
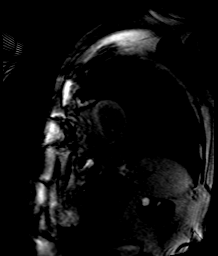

[Series 52: (id)_short_t1_moco · oblique · 8.0mm · 1.41mm/px · 1 of 27 slices shown]
[im 1/27]
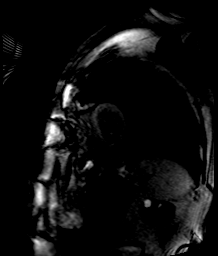

[Series 53: (id)_short_t1_moco_t1 · oblique · 8.0mm · 1.41mm/px · 1 of 6 slices shown]
[im 1/6]
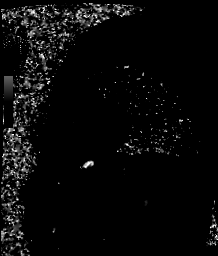

[Series 56: bSSFP · oblique · 6.0mm · 1.73mm/px · 1 of 17 slices shown (21 of 25)]
[im 1/17]
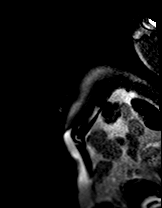

[Series 57: bSSFP · oblique · 6.0mm · 1.73mm/px · 1 of 17 slices shown (22 of 25)]
[im 1/17]
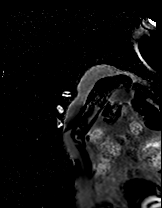

[Series 58: bSSFP · oblique · 6.0mm · 1.73mm/px · 1 of 9 slices shown (23 of 25)]
[im 1/9]
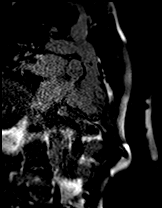

[Series 59: bSSFP · oblique · 6.0mm · 1.73mm/px · 1 of 9 slices shown (24 of 25)]
[im 1/9]
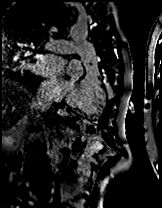

[Series 60: bSSFP · axial · 6.0mm · 1.73mm/px · 1 of 1 slices shown (25 of 25)]
[im 1/1]
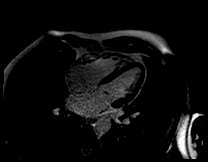

[44 of 48 positions shown; findings below may reference images not displayed]

FINDINGS: 1. Normal left ventricular size, thickness and systolic function
(LVEF =70%). There are no regional wall motion abnormalities.

There is subendocardial late gadolinium enhancement in the basal and
mid inferior wall of the left ventricular myocardium (involving 50%
wall thickness) consistent with prior infarct.

LGE mass was 8.1g constituting 6.6% of total LV myocardium.

LVEDV: 121 ml

LVESV: 36 ml

SV: 85 ml

CO: 4.9 L/min

Myocardial mass: 121g

2. Normal right ventricular size, thickness and systolic function
(RVEF =65). There are no regional wall motion abnormalities.

3.  Normal left atrial size, and right atrial size.

4. Normal size of the aortic root, ascending aorta and pulmonary
artery.

5.  Aortic valve stenosis noted.  Enface area of 1.19cm4

6.  Normal pericardium.  No pericardial effusion.
IMPRESSION: 1.  Normal left and right ventricular systolic function, LVEF = 70%

2. Subendocardial LGE/scar involving the basal and mid inferior wall
of the left ventricle consistent with prior infarct, 6.6% of LV
myocardial mass.

3. Mild to moderate aortic valve stenosis, calculated enface
planimetry area of 1.19cm4

Jhemboy Padam
